# Patient Record
Sex: Female | Born: 1985 | Race: White | Hispanic: No | Marital: Married | Smoking: Never smoker
Health system: Southern US, Community
[De-identification: ages and names within clinical notes are randomized; demographics above are authoritative.]

## PROBLEM LIST (undated history)

## (undated) ENCOUNTER — Inpatient Hospital Stay (HOSPITAL_COMMUNITY): Payer: Self-pay

## (undated) DIAGNOSIS — B958 Unspecified staphylococcus as the cause of diseases classified elsewhere: Secondary | ICD-10-CM

## (undated) DIAGNOSIS — Z98891 History of uterine scar from previous surgery: Secondary | ICD-10-CM

## (undated) DIAGNOSIS — B029 Zoster without complications: Secondary | ICD-10-CM

## (undated) DIAGNOSIS — K900 Celiac disease: Secondary | ICD-10-CM

## (undated) HISTORY — PX: UPPER GI ENDOSCOPY: SHX6162

## (undated) HISTORY — PX: WISDOM TOOTH EXTRACTION: SHX21

## (undated) HISTORY — PX: PILONIDAL CYST / SINUS EXCISION: SUR543

## (undated) HISTORY — PX: TOE SURGERY: SHX1073

---

## 2010-09-21 ENCOUNTER — Other Ambulatory Visit (HOSPITAL_COMMUNITY): Payer: Self-pay | Admitting: Gastroenterology

## 2010-09-21 DIAGNOSIS — R110 Nausea: Secondary | ICD-10-CM

## 2010-09-29 ENCOUNTER — Ambulatory Visit (HOSPITAL_COMMUNITY)
Admission: RE | Admit: 2010-09-29 | Discharge: 2010-09-29 | Disposition: A | Discharge status: Home / Self Care | Payer: Self-pay | Source: Ambulatory Visit | Attending: Gastroenterology | Admitting: Gastroenterology

## 2010-09-29 DIAGNOSIS — R109 Unspecified abdominal pain: Secondary | ICD-10-CM | POA: Insufficient documentation

## 2010-09-29 DIAGNOSIS — R110 Nausea: Secondary | ICD-10-CM | POA: Insufficient documentation

## 2011-01-01 ENCOUNTER — Other Ambulatory Visit (HOSPITAL_COMMUNITY)
Admission: RE | Admit: 2011-01-01 | Discharge: 2011-01-01 | Disposition: A | Discharge status: Home / Self Care | Payer: Self-pay | Source: Ambulatory Visit | Attending: Family Medicine | Admitting: Family Medicine

## 2011-01-01 DIAGNOSIS — Z01419 Encounter for gynecological examination (general) (routine) without abnormal findings: Secondary | ICD-10-CM | POA: Insufficient documentation

## 2011-07-20 ENCOUNTER — Emergency Department (HOSPITAL_COMMUNITY)
Admission: EM | Admit: 2011-07-20 | Discharge: 2011-07-20 | Disposition: A | Discharge status: Home / Self Care | Payer: BC Managed Care – PPO | Attending: Emergency Medicine | Admitting: Emergency Medicine

## 2011-07-20 ENCOUNTER — Emergency Department (HOSPITAL_COMMUNITY): Payer: BC Managed Care – PPO

## 2011-07-20 ENCOUNTER — Encounter (HOSPITAL_COMMUNITY): Payer: Self-pay | Admitting: *Deleted

## 2011-07-20 DIAGNOSIS — R109 Unspecified abdominal pain: Secondary | ICD-10-CM | POA: Insufficient documentation

## 2011-07-20 DIAGNOSIS — R112 Nausea with vomiting, unspecified: Secondary | ICD-10-CM | POA: Insufficient documentation

## 2011-07-20 LAB — COMPREHENSIVE METABOLIC PANEL
ALT: 14 U/L (ref 0–35)
Alkaline Phosphatase: 63 U/L (ref 39–117)
BUN: 10 mg/dL (ref 6–23)
CO2: 25 mEq/L (ref 19–32)
GFR calc Af Amer: >90 mL/min (ref >90)
GFR calc non Af Amer: >90 mL/min (ref >90)
Glucose, Bld: 108 mg/dL — ABNORMAL HIGH (ref 70–99)
Potassium: 3.6 mEq/L (ref 3.5–5.1)
Sodium: 140 mEq/L (ref 135–145)
Total Bilirubin: 0.3 mg/dL (ref 0.3–1.2)

## 2011-07-20 LAB — DIFFERENTIAL
Lymphocytes Relative: 4 % — ABNORMAL LOW (ref 12–46)
Lymphs Abs: 0.4 10*3/uL — ABNORMAL LOW (ref 0.7–4.0)
Monocytes Relative: 10 % (ref 3–12)
Neutrophils Relative %: 86 % — ABNORMAL HIGH (ref 43–77)

## 2011-07-20 LAB — URINALYSIS, ROUTINE W REFLEX MICROSCOPIC
Glucose, UA: NEGATIVE mg/dL
Hgb urine dipstick: NEGATIVE
Ketones, ur: 15 mg/dL — AB
Protein, ur: NEGATIVE mg/dL
pH: 7 (ref 5.0–8.0)

## 2011-07-20 LAB — CBC
Hemoglobin: 13.2 g/dL (ref 12.0–15.0)
MCH: 29.3 pg (ref 26.0–34.0)
MCV: 86.4 fL (ref 78.0–100.0)
Platelets: 160 10*3/uL (ref 150–400)
RBC: 4.5 MIL/uL (ref 3.87–5.11)
WBC: 11.8 10*3/uL — ABNORMAL HIGH (ref 4.0–10.5)

## 2011-07-20 LAB — LIPASE, BLOOD: Lipase: 29 U/L (ref 11–59)

## 2011-07-20 MED ORDER — METOCLOPRAMIDE HCL 5 MG/ML IJ SOLN
10.0000 mg | Freq: Once | INTRAMUSCULAR | Status: AC
  Administered 2011-07-20: 10 mg via INTRAVENOUS
  Filled 2011-07-20: qty 2

## 2011-07-20 MED ORDER — ONDANSETRON 8 MG PO TBDP: 8.0000 mg | ORAL_TABLET | Freq: Three times a day (TID) | ORAL | Status: AC | PRN

## 2011-07-20 MED ORDER — METOCLOPRAMIDE HCL 10 MG PO TABS: 10.0000 mg | ORAL_TABLET | Freq: Four times a day (QID) | ORAL | Status: DC

## 2011-07-20 MED ORDER — ONDANSETRON HCL 4 MG/2ML IJ SOLN
4.0000 mg | Freq: Once | INTRAMUSCULAR | Status: AC
  Administered 2011-07-20: 4 mg via INTRAVENOUS
  Filled 2011-07-20: qty 2

## 2011-07-20 MED ORDER — SODIUM CHLORIDE 0.9 % IV SOLN
1000.0000 mL | Freq: Once | INTRAVENOUS | Status: AC
  Administered 2011-07-20: 1000 mL via INTRAVENOUS

## 2011-07-20 MED ORDER — HYDROMORPHONE HCL PF 1 MG/ML IJ SOLN
1.0000 mg | Freq: Once | INTRAMUSCULAR | Status: AC
  Administered 2011-07-20: 1 mg via INTRAVENOUS
  Filled 2011-07-20: qty 1

## 2011-07-20 MED ORDER — ONDANSETRON HCL 4 MG/2ML IJ SOLN
4.0000 mg | Freq: Once | INTRAMUSCULAR | Status: AC
  Administered 2011-07-20: 4 mg via INTRAVENOUS
  Filled 2011-07-20: qty 4

## 2011-07-20 MED ORDER — SODIUM CHLORIDE 0.9 % IV SOLN
1000.0000 mL | INTRAVENOUS | Status: DC
  Administered 2011-07-20: 1000 mL via INTRAVENOUS

## 2011-07-20 NOTE — ED Notes (Signed)
Pt, with hx of IBS, to ED c/o nausea x 2 weeks q time she eats and epigastric pain and emesis x 2 today.

## 2011-07-20 NOTE — ED Notes (Signed)
Patient transported to Ultrasound 

## 2011-07-20 NOTE — ED Provider Notes (Signed)
History     CSN: [id number]  Arrival date & time 07/20/11  0031   First MD Initiated Contact with Patient 07/20/11 0046      Chief Complaint  Patient presents with  . Abdominal Pain    (Consider location/radiation/quality/duration/timing/severity/associated sxs/prior treatment) HPI Patient has been having difficulty with nausea and a mild amount of abdominal discomfort for the last 2 weeks. She has not had any trouble with vomiting or diarrhea. She has not had any fevers or dysuria. This evening after eating she had a recurrence in an increase in the abdominal pain. Primarily located in her upper abdomen but does seem to spread throughout her abdomen. Does not radiate to her back or her pelvic region.  The pain is sharp and severe. Nothing seems to make it better or worse. She has had no prior surgeries. History reviewed. No pertinent past medical history.  History reviewed. No pertinent past surgical history.  No family history on file.  History  Substance Use Topics  . Smoking status: Never Smoker   . Smokeless tobacco: Not on file  . Alcohol Use: No    OB History    Grav Para Term Preterm Abortions TAB SAB Ect Mult Living                  Review of Systems  All other systems reviewed and are negative.    Allergies  Review of patient's allergies indicates no known allergies.  Home Medications  No current outpatient prescriptions on file.  BP 120/68  Pulse 88  Temp(Src) 98.2 F (36.8 C) (Oral)  Resp 20  SpO2 99%  LMP 07/06/2011  Physical Exam  Nursing note and vitals reviewed. Constitutional: She appears well-developed and well-nourished. No distress.  HENT:  Head: Normocephalic and atraumatic.  Right Ear: External ear normal.  Left Ear: External ear normal.  Eyes: Conjunctivae are normal. Right eye exhibits no discharge. Left eye exhibits no discharge. No scleral icterus.  Neck: Neck supple. No tracheal deviation present.  Cardiovascular: Normal rate,  regular rhythm and intact distal pulses.   Pulmonary/Chest: Effort normal and breath sounds normal. No stridor. No respiratory distress. She has no wheezes. She has no rales.  Abdominal: Soft. Bowel sounds are normal. She exhibits no distension, no ascites and no mass. There is tenderness in the right upper quadrant and epigastric area. There is no rigidity, no rebound and no guarding. No hernia.  Musculoskeletal: She exhibits no edema and no tenderness.  Neurological: She is alert. She has normal strength. No sensory deficit. Cranial nerve deficit:  no gross defecits noted. She exhibits normal muscle tone. She displays no seizure activity. Coordination normal.  Skin: Skin is warm and dry. No rash noted.  Psychiatric: She has a normal mood and affect.    ED Course  Procedures (including critical care time)  Labs Reviewed  URINALYSIS, ROUTINE W REFLEX MICROSCOPIC - Abnormal; Notable for the following:    Ketones, ur 15 (*)    Leukocytes, UA TRACE (*)    All other components within normal limits  CBC - Abnormal; Notable for the following:    WBC 11.8 (*)    All other components within normal limits  DIFFERENTIAL - Abnormal; Notable for the following:    Neutrophils Relative 86 (*)    Neutro Abs 10.1 (*)    Lymphocytes Relative 4 (*)    Lymphs Abs 0.4 (*)    Monocytes Absolute 1.1 (*)    All other components within normal limits  COMPREHENSIVE METABOLIC PANEL - Abnormal; Notable for the following:    Glucose, Bld 108 (*)    All other components within normal limits  URINE MICROSCOPIC-ADD ON - Abnormal; Notable for the following:    Squamous Epithelial / LPF FEW (*)    Bacteria, UA FEW (*)    All other components within normal limits  POCT PREGNANCY, URINE  LIPASE, BLOOD   US Abdomen Complete  07/20/2011  *RADIOLOGY REPORT*  Clinical Data:  Abdominal pain.  ABDOMINAL ULTRASOUND COMPLETE  Comparison:  Abdominal ultrasound performed 09/29/2010  Findings:  Gallbladder:  The gallbladder  is normal in appearance, without evidence for gallstones, gallbladder wall thickening or pericholecystic fluid.  No ultrasonographic Murphy's sign is elicited.  Common Bile Duct:  0.5 cm in diameter; within normal limits in caliber.  Liver:  Normal parenchymal echogenicity and echotexture; no focal lesions identified.  Limited Doppler evaluation demonstrates normal blood flow within the liver.  IVC:  Unremarkable in appearance.  Pancreas:  Although the pancreas is difficult to visualize in its entirety due to overlying bowel gas, no focal pancreatic abnormality is identified.  Spleen:  10.1 cm in length; within normal limits in size and echotexture.  Right kidney:  11.4 cm in length; normal in size, configuration and parenchymal echogenicity.  No evidence of mass or hydronephrosis.  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  Otherwise normal in size, configuration and parenchymal echogenicity.  No evidence of mass or hydronephrosis.  Abdominal Aorta:  Normal in caliber; no aneurysm identified.  IMPRESSION:  1.  No acute abnormalities seen within the abdomen. 2.  Duplex left kidney incidentally noted.  Original Report Authenticated By: Tonia Ghent, M.D.      MDM  4:14 AM Repeat Exam.  No ttp rlq or ruq.  No rebound or guarding.   The patient was treated with IV fluids and antinausea medications. She is feeling better at this time. She does not have any tenderness in the right lower abdomen. There is low suspicion for appendicitis. The ultrasound of her abdomen does not show evidence of cholecystitis, cholelithiasis or aortic aneurysm. She has no hematuria or signs of urinary tract infection. I doubt renal colic. I suspect her symptoms could be related to a gastroenteritis. Patient discharged home with a prescription for nausea medications. I instructed her to return to emergency room for worsening symptoms, abdominal pain in the lower abdomen       Celene Kras,  MD 07/20/11 [phone]

## 2011-07-20 NOTE — ED Notes (Signed)
IV team at bedside 

## 2011-07-20 NOTE — ED Notes (Signed)
abd pain for 2 weeks with n v tonight.  No diarrhea.  lmp 2 weeks ago

## 2011-07-20 NOTE — Discharge Instructions (Signed)
Abdominal Pain Abdominal pain can be caused by many things. Your caregiver decides the seriousness of your pain by an examination and possibly blood tests and X-rays. Many cases can be observed and treated at home. Most abdominal pain is not caused by a disease and will probably improve without treatment. However, in many cases, more time must pass before a clear cause of the pain can be found. Before that point, it may not be known if you need more testing, or if hospitalization or surgery is needed. HOME CARE INSTRUCTIONS   Do not take laxatives unless directed by your caregiver.   Take pain medicine only as directed by your caregiver.   Only take over-the-counter or prescription medicines for pain, discomfort, or fever as directed by your caregiver.   Try a clear liquid diet (broth, tea, or water) for as long as directed by your caregiver. Slowly move to a bland diet as tolerated.  SEEK IMMEDIATE MEDICAL CARE IF:   The pain does not go away.   You have a fever.   You keep throwing up (vomiting).   The pain is felt only in portions of the abdomen. Pain in the right side could possibly be appendicitis. In an adult, pain in the left lower portion of the abdomen could be colitis or diverticulitis.   You pass bloody or black tarry stools.  MAKE SURE YOU:   Understand these instructions.   Will watch your condition.   Will get help right away if you are not doing well or get worse.  Document Released: 01/03/2005 Document Revised: 03/15/2011 Document Reviewed: 11/12/2007 [facility] Patient Information 2012 [place], [place].Nausea and Vomiting Nausea means you feel sick to your stomach. Throwing up (vomiting) is a reflex where stomach contents come out of your mouth. HOME CARE   Take medicine as told by your doctor.   Do not force yourself to eat. However, you do need to drink fluids.   If you feel like eating, eat a normal diet as told by your doctor.   Eat rice, wheat, potatoes,  bread, lean meats, yogurt, fruits, and vegetables.   Avoid high-fat foods.   Drink enough fluids to keep your pee (urine) clear or pale yellow.   Ask your doctor how to replace body fluid losses (rehydrate). Signs of body fluid loss (dehydration) include:   Feeling very thirsty.   Dry lips and mouth.   Feeling dizzy.   Dark pee.   Peeing less than normal.   Feeling confused.   Fast breathing or heart rate.  GET HELP RIGHT AWAY IF:   You have blood in your throw up.   You have black or bloody poop (stool).   You have a bad headache or stiff neck.   You feel confused.   You have bad belly (abdominal) pain.   You have chest pain or trouble breathing.   You do not pee at least once every 8 hours.   You have cold, clammy skin.   You keep throwing up after 24 to 48 hours.   You have a fever.  MAKE SURE YOU:   Understand these instructions.   Will watch your condition.   Will get help right away if you are not doing well or get worse.  Document Released: 09/12/2007 Document Revised: 03/15/2011 Document Reviewed: 08/25/2010 [facility] Patient Information 2012 [place], [place].

## 2011-07-20 NOTE — ED Notes (Signed)
2 attempts by this RN and 1 by another RN.  IV team paged and responded, stating they wiould be here shortly.

## 2011-07-20 NOTE — ED Notes (Signed)
Pt states pain reduced to 3/10 and nausea beginning to reduce.

## 2011-07-31 ENCOUNTER — Other Ambulatory Visit: Payer: Self-pay | Admitting: Gastroenterology

## 2011-07-31 DIAGNOSIS — R112 Nausea with vomiting, unspecified: Secondary | ICD-10-CM

## 2011-07-31 DIAGNOSIS — R1013 Epigastric pain: Secondary | ICD-10-CM

## 2011-08-16 ENCOUNTER — Other Ambulatory Visit: Payer: Self-pay | Admitting: Gastroenterology

## 2011-08-16 DIAGNOSIS — R110 Nausea: Secondary | ICD-10-CM

## 2011-08-28 ENCOUNTER — Encounter (HOSPITAL_COMMUNITY)
Admission: RE | Admit: 2011-08-28 | Discharge: 2011-08-28 | Disposition: A | Discharge status: Home / Self Care | Payer: BC Managed Care – PPO | Source: Ambulatory Visit | Attending: Gastroenterology | Admitting: Gastroenterology

## 2011-08-28 DIAGNOSIS — R109 Unspecified abdominal pain: Secondary | ICD-10-CM | POA: Insufficient documentation

## 2011-08-28 DIAGNOSIS — R110 Nausea: Secondary | ICD-10-CM | POA: Insufficient documentation

## 2011-08-28 MED ORDER — SINCALIDE 5 MCG IJ SOLR
INTRAMUSCULAR | Status: AC
  Administered 2011-08-28: 1.49 ug
  Filled 2011-08-28: qty 5

## 2011-08-28 MED ORDER — TECHNETIUM TC 99M MEBROFENIN IV KIT
5.0000 | PACK | Freq: Once | INTRAVENOUS | Status: AC | PRN
  Administered 2011-08-28: 5 via INTRAVENOUS

## 2011-10-13 ENCOUNTER — Emergency Department (HOSPITAL_COMMUNITY)
Admission: EM | Admit: 2011-10-13 | Discharge: 2011-10-13 | Disposition: A | Discharge status: Home / Self Care | Payer: BC Managed Care – PPO | Attending: Emergency Medicine | Admitting: Emergency Medicine

## 2011-10-13 ENCOUNTER — Encounter (HOSPITAL_COMMUNITY): Payer: Self-pay | Admitting: Emergency Medicine

## 2011-10-13 DIAGNOSIS — L089 Local infection of the skin and subcutaneous tissue, unspecified: Secondary | ICD-10-CM

## 2011-10-13 DIAGNOSIS — L0889 Other specified local infections of the skin and subcutaneous tissue: Secondary | ICD-10-CM | POA: Insufficient documentation

## 2011-10-13 DIAGNOSIS — K900 Celiac disease: Secondary | ICD-10-CM | POA: Insufficient documentation

## 2011-10-13 HISTORY — DX: Celiac disease: K90.0

## 2011-10-13 LAB — CBC WITH DIFFERENTIAL/PLATELET
Eosinophils Absolute: 0.1 10*3/uL (ref 0.0–0.7)
Eosinophils Relative: 1 % (ref 0–5)
HCT: 36.9 % (ref 36.0–46.0)
Hemoglobin: 12.4 g/dL (ref 12.0–15.0)
Lymphs Abs: 2.5 10*3/uL (ref 0.7–4.0)
MCH: 29 pg (ref 26.0–34.0)
MCV: 86.2 fL (ref 78.0–100.0)
Monocytes Absolute: 0.6 10*3/uL (ref 0.1–1.0)
Monocytes Relative: 9 % (ref 3–12)
Neutrophils Relative %: 50 % (ref 43–77)
RBC: 4.28 MIL/uL (ref 3.87–5.11)

## 2011-10-13 MED ORDER — CLINDAMYCIN HCL 150 MG PO CAPS: 150.0000 mg | ORAL_CAPSULE | Freq: Four times a day (QID) | ORAL | Status: AC

## 2011-10-13 MED ORDER — SODIUM CHLORIDE 0.9 % IV SOLN
Freq: Once | INTRAVENOUS | Status: AC
  Administered 2011-10-13: 21:00:00 via INTRAVENOUS

## 2011-10-13 MED ORDER — KETOROLAC TROMETHAMINE 30 MG/ML IJ SOLN
30.0000 mg | Freq: Once | INTRAMUSCULAR | Status: AC
  Administered 2011-10-13: 30 mg via INTRAVENOUS
  Filled 2011-10-13: qty 1

## 2011-10-13 MED ORDER — SODIUM CHLORIDE 0.9 % IV SOLN: Freq: Once | INTRAVENOUS | Status: DC

## 2011-10-13 MED ORDER — CLINDAMYCIN PHOSPHATE 900 MG/50ML IV SOLN
900.0000 mg | Freq: Once | INTRAVENOUS | Status: AC
  Administered 2011-10-13: 900 mg via INTRAVENOUS
  Filled 2011-10-13 (×2): qty 50

## 2011-10-13 NOTE — ED Notes (Signed)
Patient complaining of possible bug bites on her chin; was seen at a walk-in clinic this past week.  Was given an antibiotic due to seeping pus from the wound; patient states that she has had two more "sores" pop up on her hands since her visit.

## 2011-10-13 NOTE — ED Notes (Signed)
Pt for discharge.Vital signs stable and GCS 15 

## 2011-10-13 NOTE — ED Provider Notes (Signed)
History     CSN: [id number]  Arrival date & time 10/13/11  1911   None     Chief Complaint  Patient presents with  . Insect Bite    (Consider location/radiation/quality/duration/timing/severity/associated sxs/prior treatment) HPI Comments: Patient is currently being treated for a "bug bite on her chin."  With doxycycline, that she's been taking for the past 5 days in the past 24 hours.  She has noted that she has developed new lesions on her neck and on her left index finger, right at the edge of the nailbed is a dorsal aspect of the left thumb or painful.  The lesion of the edge of her nail has been draining for the past 12 hours.  She denies fever, but does report generalized myalgias, and a headache  The history is provided by the patient.    Past Medical History  Diagnosis Date  . Celiac disease     History reviewed. No pertinent past surgical history.  History reviewed. No pertinent family history.  History  Substance Use Topics  . Smoking status: Never Smoker   . Smokeless tobacco: Not on file  . Alcohol Use: No    OB History    Grav Para Term Preterm Abortions TAB SAB Ect Mult Living                  Review of Systems  Constitutional: Positive for chills. Negative for fever.  HENT: Negative for mouth sores, trouble swallowing and dental problem.   Musculoskeletal: Negative for joint swelling.  Skin: Positive for wound.  Neurological: Positive for headaches.    Allergies  Review of patient's allergies indicates no known allergies.  Home Medications   Current Outpatient Rx  Name Route Sig Dispense Refill  . DEXLANSOPRAZOLE 60 MG PO CPDR Oral Take 60 mg by mouth daily.    Marland Kitchen DOXYCYCLINE HYCLATE 100 MG PO TABS Oral Take 100 mg by mouth 2 (two) times daily.    Marland Kitchen FLUCONAZOLE 150 MG PO TABS Oral Take 150 mg by mouth once.    Marland Kitchen CLINDAMYCIN HCL 150 MG PO CAPS Oral Take 1 capsule (150 mg total) by mouth every 6 (six) hours. 28 capsule 0    BP 100/68  Pulse  61  Temp 98.1 F (36.7 C) (Oral)  Resp 16  SpO2 99%  LMP 10/02/2011  Physical Exam  Constitutional: She appears well-developed and well-nourished.  HENT:  Head: Normocephalic.       No oral or dental lesions  Eyes: Pupils are equal, round, and reactive to light.  Neck: Normal range of motion.  Cardiovascular: Normal rate.   Pulmonary/Chest: Effort normal.  Musculoskeletal: Normal range of motion. She exhibits tenderness. She exhibits no edema.       Small draining lesion at the, distal tip of the left index finger, age of the nail.  Vision, has clipped her fingernails.  Below this area to facilitate draining  Lymphadenopathy:       Head (right side): Submental adenopathy present. No submandibular, no tonsillar and no preauricular adenopathy present.    She has no cervical adenopathy.  Neurological: She is alert.  Skin: Skin is warm.    ED Course  Procedures (including critical care time)   Labs Reviewed  CBC WITH DIFFERENTIAL   No results found.   1. Skin infection       MDM  Will obtain CBC, place, an IV give patient 1 L of fluid, +30 mg of Toradol.  I headache, and myalgias as  well as 900 mg of clindamycin as a first dose changing her antibiotic regime.  She does have a primary care physician, who she will be able to followup with next week After IV by diuretics completed.  The sore on her chin as well as her finger appear smaller and less red       Arman Filter, NP 10/13/11 2052  Arman Filter, NP 10/13/11 2311

## 2011-10-19 NOTE — ED Provider Notes (Signed)
Medical screening examination/treatment/procedure(s) were performed by non-physician practitioner and as supervising physician I was immediately available for consultation/collaboration.  Avonte Sensabaugh, MD 10/19/11 0006 

## 2012-01-01 ENCOUNTER — Emergency Department (HOSPITAL_COMMUNITY): Payer: BC Managed Care – PPO

## 2012-01-01 ENCOUNTER — Encounter (HOSPITAL_COMMUNITY): Payer: Self-pay | Admitting: Emergency Medicine

## 2012-01-01 ENCOUNTER — Emergency Department (HOSPITAL_COMMUNITY)
Admission: EM | Admit: 2012-01-01 | Discharge: 2012-01-01 | Disposition: A | Discharge status: Home / Self Care | Payer: BC Managed Care – PPO | Attending: Emergency Medicine | Admitting: Emergency Medicine

## 2012-01-01 DIAGNOSIS — R52 Pain, unspecified: Secondary | ICD-10-CM | POA: Insufficient documentation

## 2012-01-01 DIAGNOSIS — N390 Urinary tract infection, site not specified: Secondary | ICD-10-CM

## 2012-01-01 DIAGNOSIS — R109 Unspecified abdominal pain: Secondary | ICD-10-CM

## 2012-01-01 DIAGNOSIS — Z8719 Personal history of other diseases of the digestive system: Secondary | ICD-10-CM | POA: Insufficient documentation

## 2012-01-01 LAB — WET PREP, GENITAL
Clue Cells Wet Prep HPF POC: NONE SEEN
Trich, Wet Prep: NONE SEEN
Yeast Wet Prep HPF POC: NONE SEEN

## 2012-01-01 LAB — CBC WITH DIFFERENTIAL/PLATELET
Basophils Absolute: 0.1 K/uL (ref 0.0–0.1)
Basophils Relative: 1 % (ref 0–1)
Eosinophils Absolute: 0.1 K/uL (ref 0.0–0.7)
Eosinophils Relative: 1 % (ref 0–5)
HCT: 39.9 % (ref 36.0–46.0)
Hemoglobin: 13.3 g/dL (ref 12.0–15.0)
Lymphocytes Relative: 29 % (ref 12–46)
Lymphs Abs: 2.5 10*3/uL (ref 0.7–4.0)
MCH: 29.2 pg (ref 26.0–34.0)
MCHC: 33.3 g/dL (ref 30.0–36.0)
MCV: 87.5 fL (ref 78.0–100.0)
Monocytes Absolute: 0.6 K/uL (ref 0.1–1.0)
Monocytes Relative: 8 % (ref 3–12)
Neutro Abs: 5.2 10*3/uL (ref 1.7–7.7)
Neutrophils Relative %: 61 % (ref 43–77)
Platelets: 227 K/uL (ref 150–400)
RBC: 4.56 MIL/uL (ref 3.87–5.11)
RDW: 13.3 % (ref 11.5–15.5)
WBC: 8.5 10*3/uL (ref 4.0–10.5)

## 2012-01-01 LAB — URINALYSIS, ROUTINE W REFLEX MICROSCOPIC
Bilirubin Urine: NEGATIVE
Glucose, UA: NEGATIVE mg/dL
Ketones, ur: 15 mg/dL — AB
Nitrite: NEGATIVE
Protein, ur: NEGATIVE mg/dL
Specific Gravity, Urine: 1.007 (ref 1.005–1.030)
Urobilinogen, UA: 0.2 mg/dL (ref 0.0–1.0)
pH: 7 (ref 5.0–8.0)

## 2012-01-01 LAB — COMPREHENSIVE METABOLIC PANEL
Albumin: 4.9 g/dL (ref 3.5–5.2)
Alkaline Phosphatase: 65 U/L (ref 39–117)
BUN: 12 mg/dL (ref 6–23)
Chloride: 102 mEq/L (ref 96–112)
Glucose, Bld: 84 mg/dL (ref 70–99)
Potassium: 3.3 mEq/L — ABNORMAL LOW (ref 3.5–5.1)
Total Bilirubin: 0.5 mg/dL (ref 0.3–1.2)

## 2012-01-01 LAB — COMPREHENSIVE METABOLIC PANEL WITH GFR
ALT: 17 U/L (ref 0–35)
AST: 27 U/L (ref 0–37)
CO2: 25 meq/L (ref 19–32)
Calcium: 10 mg/dL (ref 8.4–10.5)
Creatinine, Ser: 0.7 mg/dL (ref 0.50–1.10)
GFR calc Af Amer: >90 mL/min (ref >90)
GFR calc non Af Amer: >90 mL/min (ref >90)
Sodium: 141 meq/L (ref 135–145)
Total Protein: 8.4 g/dL — ABNORMAL HIGH (ref 6.0–8.3)

## 2012-01-01 LAB — URINE MICROSCOPIC-ADD ON

## 2012-01-01 LAB — LIPASE, BLOOD: Lipase: 111 U/L — ABNORMAL HIGH (ref 11–59)

## 2012-01-01 LAB — POCT PREGNANCY, URINE: Preg Test, Ur: NEGATIVE

## 2012-01-01 MED ORDER — MORPHINE SULFATE 4 MG/ML IJ SOLN
4.0000 mg | Freq: Once | INTRAMUSCULAR | Status: AC
  Administered 2012-01-01: 4 mg via INTRAVENOUS
  Filled 2012-01-01: qty 1

## 2012-01-01 MED ORDER — KETOROLAC TROMETHAMINE 30 MG/ML IJ SOLN
30.0000 mg | Freq: Once | INTRAMUSCULAR | Status: AC
  Administered 2012-01-01: 30 mg via INTRAVENOUS
  Filled 2012-01-01: qty 1

## 2012-01-01 MED ORDER — CIPROFLOXACIN HCL 500 MG PO TABS: 500.0000 mg | ORAL_TABLET | Freq: Two times a day (BID) | ORAL | Status: DC

## 2012-01-01 MED ORDER — SODIUM CHLORIDE 0.9 % IV SOLN
Freq: Once | INTRAVENOUS | Status: AC
  Administered 2012-01-01: 19:00:00 via INTRAVENOUS

## 2012-01-01 MED ORDER — PHENAZOPYRIDINE HCL 200 MG PO TABS: 200.0000 mg | ORAL_TABLET | Freq: Three times a day (TID) | ORAL | Status: DC

## 2012-01-01 MED ORDER — DEXTROSE 5 % IV SOLN
1.0000 g | Freq: Once | INTRAVENOUS | Status: AC
  Administered 2012-01-01: 1 g via INTRAVENOUS
  Filled 2012-01-01: qty 10

## 2012-01-01 NOTE — ED Notes (Signed)
Patient transported to CT 

## 2012-01-01 NOTE — ED Provider Notes (Signed)
History  This chart was scribed for [name]. [name], MD by Ladona Ridgel Day. This patient was seen in room TR10C/TR10C and the patient's care was started at 1619.   CSN: [id number]  Arrival date & time 01/01/12  1619   First MD Initiated Contact with Patient 01/01/12 1819      Chief Complaint  Patient presents with  . Abdominal Pain  . Flank Pain  . Urinary Tract Infection   The history is provided by the patient. No language interpreter was used.   Yolanda Berg is a 26 y.o. female who presents to the Emergency Department complaining of lower abdominal pain which began about 8 hours ago but she states some dysuria last PM and frequency past couple of days. She states the pain sometimes intensifies and radiates to her left flank and makes her nauseous.. She denies any hx of UTIs. She states past couple of days with frequency where she might void several times per hour. She denies any emesis or diarrhea. She is currently at end of her menstrual cycle and she has no sick contacts.    Past Medical History  Diagnosis Date  . Celiac disease     History reviewed. No pertinent past surgical history.  History reviewed. No pertinent family history.  History  Substance Use Topics  . Smoking status: Never Smoker   . Smokeless tobacco: Not on file  . Alcohol Use: No    OB History    Grav Para Term Preterm Abortions TAB SAB Ect Mult Living                  Review of Systems  Constitutional: Negative for fever and chills.  Respiratory: Negative for shortness of breath.   Gastrointestinal: Positive for nausea and abdominal pain (lower abdominal and left flank pain). Negative for vomiting and diarrhea.  Genitourinary: Positive for dysuria, frequency and flank pain (left flank).  Neurological: Negative for weakness.    Allergies  Review of patient's allergies indicates no known allergies.  Home Medications   Current Outpatient Rx  Name Route Sig Dispense Refill  . DEXLANSOPRAZOLE 60  MG PO CPDR Oral Take 60 mg by mouth daily.    . ADULT MULTIVITAMIN W/MINERALS CH Oral Take 1 tablet by mouth daily.      Triage Vitals: BP 125/86  Pulse 66  Temp 97.6 F (36.4 C) (Oral)  Resp 18  SpO2 100%  Physical Exam  Nursing note and vitals reviewed. Constitutional: She is oriented to person, place, and time. She appears well-developed and well-nourished. No distress.  HENT:  Head: Normocephalic and atraumatic.  Eyes: EOM are normal.  Neck: Neck supple. No tracheal deviation present.  Cardiovascular: Normal rate.   Pulmonary/Chest: Effort normal. No respiratory distress.  Abdominal: Soft. She exhibits no distension. There is tenderness (LLQ tender to palpation, tenderness over left flank ). There is no rebound and no guarding.  Musculoskeletal: Normal range of motion.  Neurological: She is alert and oriented to person, place, and time.  Skin: Skin is warm and dry.  Psychiatric: She has a normal mood and affect. Her behavior is normal.    ED Course  Procedures (including critical care time) DIAGNOSTIC STUDIES: Oxygen Saturation is 100% on room air, normal by my interpretation.    COORDINATION OF CARE: At 635 PM Discussed treatment plan with patient which includes UA/culture/preg UA, IV fluids, toradol, pain medicine, wet prep, and pelvic exam. Patient agrees.   Labs Reviewed  URINALYSIS, ROUTINE W REFLEX MICROSCOPIC - Abnormal;  Notable for the following:    Hgb urine dipstick LARGE (*)     Ketones, ur 15 (*)     Leukocytes, UA MODERATE (*)     All other components within normal limits  URINE MICROSCOPIC-ADD ON - Abnormal; Notable for the following:    Squamous Epithelial / LPF FEW (*)     Bacteria, UA FEW (*)     All other components within normal limits  POCT PREGNANCY, URINE  CBC WITH DIFFERENTIAL  COMPREHENSIVE METABOLIC PANEL  LIPASE, BLOOD  WET PREP, GENITAL  GC/CHLAMYDIA PROBE AMP, GENITAL   No results found.   1. Flank pain, acute   2. Urinary tract  infection     7:59 PM Pelvic exam shows scant discharge, cultures and wet prep sent.  Urine culture added.  WBC is ok.  No adnexal tenderness.  Will get CT scan.  Will sign out to Dr. Juleen China and [facility].  MDM  I personally performed the services described in this documentation, which was scribed in my presence. The recorded information has been reviewed and considered.    Pt with left lower and left flank mild pain, n/v.  No hematuria on UA goes against renal colic.  Some pyuria, but not very significant compared to pt's degree of pain.  Will need pelvic exam, if no adnexal tenderness, will get CT scan.  Otherwise if adnexal tenderness or mass, will get U/S.          [name]. [name], MD 01/01/12 2001

## 2012-01-01 NOTE — ED Notes (Signed)
Pt c/o lower abd pain up into left flank starting today; pt sts difficulty time urinating yesterday and sts pain much worse after urinating today

## 2012-01-02 LAB — URINE CULTURE: Colony Count: >=100000

## 2012-01-02 LAB — GC/CHLAMYDIA PROBE AMP, GENITAL
Chlamydia, DNA Probe: NEGATIVE
GC Probe Amp, Genital: NEGATIVE

## 2012-01-04 NOTE — ED Notes (Addendum)
+   urine rx for Fluconazole 150 mg SIG: 150 mg po 1 X Disp: # 1 refill none per Mardella Layman.

## 2012-01-11 ENCOUNTER — Telehealth (HOSPITAL_COMMUNITY): Payer: Self-pay | Admitting: *Deleted

## 2012-01-11 NOTE — ED Notes (Signed)
Patient returned  Call and wants rx called to [facility] on [address]

## 2012-01-12 ENCOUNTER — Encounter (HOSPITAL_COMMUNITY): Payer: Self-pay | Admitting: Emergency Medicine

## 2012-01-12 ENCOUNTER — Emergency Department (HOSPITAL_COMMUNITY)
Admission: EM | Admit: 2012-01-12 | Discharge: 2012-01-13 | Disposition: A | Discharge status: Home / Self Care | Payer: BC Managed Care – PPO | Attending: Emergency Medicine | Admitting: Emergency Medicine

## 2012-01-12 DIAGNOSIS — B958 Unspecified staphylococcus as the cause of diseases classified elsewhere: Secondary | ICD-10-CM | POA: Insufficient documentation

## 2012-01-12 DIAGNOSIS — L089 Local infection of the skin and subcutaneous tissue, unspecified: Secondary | ICD-10-CM | POA: Insufficient documentation

## 2012-01-12 HISTORY — DX: Unspecified staphylococcus as the cause of diseases classified elsewhere: B95.8

## 2012-01-12 MED ORDER — CLINDAMYCIN PHOSPHATE 600 MG/50ML IV SOLN
600.0000 mg | Freq: Once | INTRAVENOUS | Status: AC
  Administered 2012-01-13: 600 mg via INTRAVENOUS
  Filled 2012-01-12 (×2): qty 50

## 2012-01-12 NOTE — ED Provider Notes (Signed)
History     CSN: [id number]  Arrival date & time 01/12/12  1612   None     Chief Complaint  Patient presents with  . Wound Check    Chin    (Consider location/radiation/quality/duration/timing/severity/associated sxs/prior treatment) HPI History provided by pt.   Pt presents w/ painful lesion on chin.  Draining yellow fluid.  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  She has been compliant w/ her antibiotics over the past few days, but lesion continues to get bigger.  Had the same in 10/2011 that was refractory to po abx and eventually spread to her neck and finger.  It improved w/ a single dose of IV clindamycin in ED.  Pt requests IV clindamycin today to prevent the infection from worsening.   Past Medical History  Diagnosis Date  . Celiac disease   . Staph infection     History reviewed. No pertinent past surgical history.  No family history on file.  History  Substance Use Topics  . Smoking status: Never Smoker   . Smokeless tobacco: Not on file  . Alcohol Use: No    OB History    Grav Para Term Preterm Abortions TAB SAB Ect Mult Living                  Review of Systems  All other systems reviewed and are negative.    Allergies  Review of patient's allergies indicates no known allergies.  Home Medications   Current Outpatient Rx  Name Route Sig Dispense Refill  . CLINDAMYCIN HCL 300 MG PO CAPS Oral Take 300 mg by mouth 3 (three) times daily.    . ADULT MULTIVITAMIN W/MINERALS CH Oral Take 1 tablet by mouth daily.    Marland Kitchen MUPIROCIN 2 % EX OINT Topical Apply 1 application topically 3 (three) times daily.      BP 97/54  Pulse 58  Temp 97.9 F (36.6 C) (Oral)  Resp 12  SpO2 100%  LMP 12/31/2011  Physical Exam  Nursing note and vitals reviewed. Constitutional: She is oriented to person, place, and time. She appears well-developed and  well-nourished. No distress.  HENT:  Head: Normocephalic and atraumatic.       No trismus.    Eyes:       Normal appearance  Neck: Normal range of motion.       Mild submental edema  Cardiovascular: Normal rate and regular rhythm.   Pulmonary/Chest: Effort normal and breath sounds normal. No respiratory distress.  Musculoskeletal: Normal range of motion.  Neurological: She is alert and oriented to person, place, and time.  Skin: Skin is warm and dry. No rash noted.       1cm vesicular lesion w/ a small amt of yellow crusting in the center of chin.  No active drainage.  Narrow ring of surrounding erythema.  Ttp.  Lesion can not be palpated from inside the mouth.   Psychiatric: She has a normal mood and affect. Her behavior is normal.       tearful    ED Course  Procedures (including critical care time)  Labs Reviewed - No data to display No results found.   1. Staph skin infection       MDM  26yo F presents w/ c/o recurrent staph infection of chin that has increased in size and severity of pain over the past few days, despite compliance w/  po clinda and topical mupirocin prescribed by her PCP.  Had same in 10/2011 and spread to neck and finger.  She requests a dose of IV clindamycin which seemed to help in July.  Exam is consistent w/ superficial staph infection.  Will treat patient w/ single dose of IV clindamycin.  She declines pain medication.  Recommended completion of oral abx and mupirocin or hydrogen peroxide cream,  f/u with her PCP if sx have not improved by Monday.  Return precautions discussed.         Otilio Miu, [place] 01/13/12 [phone]

## 2012-01-12 NOTE — ED Notes (Signed)
Pt reports possible skin infection onset Wednesday. Pt seen by PMD and given PO antibiotics. Pt reports area increasing in size and draining pus. Pt tearful in triage.

## 2012-01-12 NOTE — ED Notes (Signed)
Pt st's she has a skin infection on her chin.  St's she had the same thing approx 1 yr ago and had to have IV antibiotics.

## 2012-01-13 NOTE — ED Notes (Signed)
Ice pack given

## 2012-01-14 NOTE — ED Provider Notes (Signed)
Medical screening examination/treatment/procedure(s) were performed by non-physician practitioner and as supervising physician I was immediately available for consultation/collaboration.   Richardean Canal, MD 01/14/12 [phone]

## 2012-04-30 ENCOUNTER — Emergency Department (HOSPITAL_BASED_OUTPATIENT_CLINIC_OR_DEPARTMENT_OTHER): Payer: BC Managed Care – PPO

## 2012-04-30 ENCOUNTER — Encounter (HOSPITAL_BASED_OUTPATIENT_CLINIC_OR_DEPARTMENT_OTHER): Payer: Self-pay | Admitting: Family Medicine

## 2012-04-30 ENCOUNTER — Emergency Department (HOSPITAL_BASED_OUTPATIENT_CLINIC_OR_DEPARTMENT_OTHER)
Admission: EM | Admit: 2012-04-30 | Discharge: 2012-04-30 | Disposition: A | Discharge status: Home / Self Care | Payer: BC Managed Care – PPO | Attending: Emergency Medicine | Admitting: Emergency Medicine

## 2012-04-30 DIAGNOSIS — R220 Localized swelling, mass and lump, head: Secondary | ICD-10-CM | POA: Insufficient documentation

## 2012-04-30 DIAGNOSIS — L089 Local infection of the skin and subcutaneous tissue, unspecified: Secondary | ICD-10-CM | POA: Insufficient documentation

## 2012-04-30 DIAGNOSIS — R221 Localized swelling, mass and lump, neck: Secondary | ICD-10-CM | POA: Insufficient documentation

## 2012-04-30 DIAGNOSIS — R51 Headache: Secondary | ICD-10-CM | POA: Insufficient documentation

## 2012-04-30 DIAGNOSIS — Z8719 Personal history of other diseases of the digestive system: Secondary | ICD-10-CM | POA: Insufficient documentation

## 2012-04-30 DIAGNOSIS — Z8619 Personal history of other infectious and parasitic diseases: Secondary | ICD-10-CM | POA: Insufficient documentation

## 2012-04-30 MED ORDER — CEPHALEXIN 500 MG PO CAPS: 500.0000 mg | ORAL_CAPSULE | Freq: Four times a day (QID) | ORAL | Status: DC

## 2012-04-30 MED ORDER — CEPHALEXIN 250 MG PO CAPS
500.0000 mg | ORAL_CAPSULE | Freq: Once | ORAL | Status: AC
  Administered 2012-04-30: 500 mg via ORAL
  Filled 2012-04-30: qty 2

## 2012-04-30 MED ORDER — SULFAMETHOXAZOLE-TRIMETHOPRIM 800-160 MG PO TABS: 1.0000 | ORAL_TABLET | Freq: Two times a day (BID) | ORAL | Status: DC

## 2012-04-30 MED ORDER — HYDROCODONE-ACETAMINOPHEN 5-325 MG PO TABS
1.0000 | ORAL_TABLET | Freq: Once | ORAL | Status: AC
  Administered 2012-04-30: 1 via ORAL
  Filled 2012-04-30: qty 1

## 2012-04-30 MED ORDER — HYDROCODONE-ACETAMINOPHEN 5-325 MG PO TABS: 2.0000 | ORAL_TABLET | ORAL | Status: DC | PRN

## 2012-04-30 NOTE — ED Provider Notes (Signed)
History  This chart was scribed for Yolanda Octave, MD by Yolanda Berg, ED Scribe. This patient was seen in room MH09/MH09 and the patient's care was started at 5:36 PM.  CSN: [id number]  Arrival date & time 04/30/12  1658   First MD Initiated Contact with Patient 04/30/12 1736      Chief Complaint  Patient presents with  . Sore    The history is provided by the patient. No language interpreter was used.    Yolanda Berg is a 27 y.o. female who presents to the Emergency Department complaining of 4 days of gradual onset, gradually worsening, constant skin sore to the right chin with associated swelling and pain that radiates in the anterior neck. She reports that she has prior episodes of similar sores in the same spot with a prior diagnosis of staph infection. She was seen by the dermatologist this morning for the same and had cultures taken. She reports that she was prescribe valtrix and took one dose this morning. She states that she has been prescribed valtrex before for the same with no improvement. She also reports that she took one dose of ibuprofen with no improvement in pain or swelling. She denies fever, sore throat, dental pain, emesis, trouble swallowing, CP and SOB as associated symptoms. She has a h/o Celiac disease and denies smoking and alcohol use.  Past Medical History  Diagnosis Date  . Celiac disease   . Staph infection     History reviewed. No pertinent past surgical history.  No family history on file.  History  Substance Use Topics  . Smoking status: Never Smoker   . Smokeless tobacco: Not on file  . Alcohol Use: No    No OB history provided.  Review of Systems  A complete 10 system review of systems was obtained and all systems are negative except as noted in the HPI and PMH.   Allergies  Review of patient's allergies indicates no known allergies.  Home Medications   Current Outpatient Rx  Name  Route  Sig  Dispense  Refill  . CLINDAMYCIN  HCL 300 MG PO CAPS   Oral   Take 300 mg by mouth 3 (three) times daily.         . ADULT MULTIVITAMIN W/MINERALS CH   Oral   Take 1 tablet by mouth daily.         Marland Kitchen MUPIROCIN 2 % EX OINT   Topical   Apply 1 application topically 3 (three) times daily.           Triage Vitals: BP 121/77  Pulse 120  Temp 98.5 F (36.9 C) (Oral)  Resp 16  Ht 5\' 9"  (1.753 m)  Wt 160 lb (72.576 kg)  BMI 23.63 kg/m2  SpO2 100%  LMP 04/03/2012  Physical Exam  Nursing note and vitals reviewed. Constitutional: She is oriented to person, place, and time. She appears well-developed and well-nourished. No distress.  HENT:  Head: Normocephalic and atraumatic.       erythematous vesicular skin lesion to the right chin, no erythema or exudate to the oropharynx, dentition intact, floor of mouth is soft, no difficulty handling secretions  Eyes: Conjunctivae normal and EOM are normal. Pupils are equal, round, and reactive to light.  Neck: Neck supple. No tracheal deviation present.       palpable submandibular lymph nodes   Cardiovascular: Normal rate and regular rhythm.   Pulmonary/Chest: Effort normal and breath sounds normal. No respiratory distress.  Abdominal: Soft. There  is no tenderness.  Musculoskeletal: Normal range of motion. She exhibits no edema.  Lymphadenopathy:    She has cervical adenopathy.  Neurological: She is alert and oriented to person, place, and time.  Skin: Skin is warm and dry.  Psychiatric: She has a normal mood and affect. Her behavior is normal.    ED Course  Procedures (including critical care time)  DIAGNOSTIC STUDIES: Oxygen Saturation is 100% on room air, normal by my interpretation.    COORDINATION OF CARE: 5:48 PM-Discussed treatment plan which includes neck XR, rapid strep, pain medications and keflex with pt at bedside and pt agreed to plan. Pt is requesting IV antibiotics; however, I informed pt that oral is the best treatment  6:43 PM-Pt rechecked and  feels improved. Informed pt of negative radiology report and discharge plan. Pt agrees at this time to the plan.   Labs Reviewed  RAPID STREP SCREEN   Dg Neck Soft Tissue  04/30/2012  *RADIOLOGY REPORT*  Clinical Data: Swollen glands and history of staph infection.  NECK SOFT TISSUES - 1+ VIEW  Comparison: None.  Findings: Single lateral view of the neck was obtained.  Normal appearance of the prevertebral soft tissues.  Normal alignment of the cervical spine.  Epiglottis is not well visualized but there is no a gross abnormality to the epiglottis or aryepiglottic folds.  IMPRESSION: Negative soft tissue neck examination.   Original Report Authenticated By: Richarda Overlie, M.D.      No diagnosis found.    MDM  Erythematous sore to her chin for the past several days. History of same with recurrent staph infections. Associated with swelling of her chin and submandibular area. No difficulty breathing or swallowing. No fever or vomiting. No chest pain or shortness of breath. Started on Valtrex by dermatologist today.  , No trismus, mild submental edema. Palpable lymph nodes. Floor of mouth soft.  Airway patent, mild submental swelling with lymph node. Patient requested IV antibiotics. I explained that they are not indicated and by mouth would be sufficient. She will continue her antivirals and follow up with a dermatologist.  I personally performed the services described in this documentation, which was scribed in my presence. The recorded information has been reviewed and is accurate.      Yolanda Octave, MD 04/30/12 2026

## 2012-04-30 NOTE — ED Notes (Addendum)
Pt c/o sore to chin and soreness to neck x 4 days. Pt sts she saw dermatologist this morning for same. Pt sts pain has worsened.

## 2013-03-21 ENCOUNTER — Emergency Department (HOSPITAL_BASED_OUTPATIENT_CLINIC_OR_DEPARTMENT_OTHER)
Admission: EM | Admit: 2013-03-21 | Discharge: 2013-03-21 | Disposition: A | Discharge status: Home / Self Care | Payer: BC Managed Care – PPO | Attending: Emergency Medicine | Admitting: Emergency Medicine

## 2013-03-21 ENCOUNTER — Encounter (HOSPITAL_BASED_OUTPATIENT_CLINIC_OR_DEPARTMENT_OTHER): Payer: Self-pay | Admitting: Emergency Medicine

## 2013-03-21 ENCOUNTER — Emergency Department (HOSPITAL_BASED_OUTPATIENT_CLINIC_OR_DEPARTMENT_OTHER): Payer: BC Managed Care – PPO

## 2013-03-21 DIAGNOSIS — R51 Headache: Secondary | ICD-10-CM | POA: Insufficient documentation

## 2013-03-21 DIAGNOSIS — M545 Low back pain, unspecified: Secondary | ICD-10-CM | POA: Insufficient documentation

## 2013-03-21 DIAGNOSIS — R000 Tachycardia, unspecified: Secondary | ICD-10-CM | POA: Insufficient documentation

## 2013-03-21 DIAGNOSIS — R509 Fever, unspecified: Secondary | ICD-10-CM | POA: Insufficient documentation

## 2013-03-21 DIAGNOSIS — J029 Acute pharyngitis, unspecified: Secondary | ICD-10-CM | POA: Insufficient documentation

## 2013-03-21 DIAGNOSIS — J3489 Other specified disorders of nose and nasal sinuses: Secondary | ICD-10-CM | POA: Insufficient documentation

## 2013-03-21 DIAGNOSIS — Z8719 Personal history of other diseases of the digestive system: Secondary | ICD-10-CM | POA: Insufficient documentation

## 2013-03-21 DIAGNOSIS — R6889 Other general symptoms and signs: Secondary | ICD-10-CM

## 2013-03-21 DIAGNOSIS — R059 Cough, unspecified: Secondary | ICD-10-CM | POA: Insufficient documentation

## 2013-03-21 DIAGNOSIS — M79609 Pain in unspecified limb: Secondary | ICD-10-CM | POA: Insufficient documentation

## 2013-03-21 DIAGNOSIS — Z8619 Personal history of other infectious and parasitic diseases: Secondary | ICD-10-CM | POA: Insufficient documentation

## 2013-03-21 DIAGNOSIS — Z3202 Encounter for pregnancy test, result negative: Secondary | ICD-10-CM | POA: Insufficient documentation

## 2013-03-21 DIAGNOSIS — R05 Cough: Secondary | ICD-10-CM | POA: Insufficient documentation

## 2013-03-21 LAB — COMPREHENSIVE METABOLIC PANEL WITH GFR
ALT: 11 U/L (ref 0–35)
AST: 19 U/L (ref 0–37)
Albumin: 3.9 g/dL (ref 3.5–5.2)
Alkaline Phosphatase: 53 U/L (ref 39–117)
BUN: 7 mg/dL (ref 6–23)
CO2: 25 meq/L (ref 19–32)
Calcium: 9 mg/dL (ref 8.4–10.5)
Chloride: 102 meq/L (ref 96–112)
Creatinine, Ser: 0.6 mg/dL (ref 0.50–1.10)
GFR calc Af Amer: >90 mL/min
GFR calc non Af Amer: >90 mL/min
Glucose, Bld: 99 mg/dL (ref 70–99)
Potassium: 3.7 meq/L (ref 3.5–5.1)
Sodium: 137 meq/L (ref 135–145)
Total Bilirubin: 0.3 mg/dL (ref 0.3–1.2)
Total Protein: 7.2 g/dL (ref 6.0–8.3)

## 2013-03-21 LAB — CBC WITH DIFFERENTIAL/PLATELET
Basophils Absolute: 0 10*3/uL (ref 0.0–0.1)
Basophils Relative: 1 % (ref 0–1)
Eosinophils Absolute: 0 10*3/uL (ref 0.0–0.7)
Eosinophils Relative: 0 % (ref 0–5)
HCT: 37.2 % (ref 36.0–46.0)
Hemoglobin: 12.5 g/dL (ref 12.0–15.0)
Lymphocytes Relative: 13 % (ref 12–46)
Lymphs Abs: 0.8 10*3/uL (ref 0.7–4.0)
MCH: 29.4 pg (ref 26.0–34.0)
MCHC: 33.6 g/dL (ref 30.0–36.0)
MCV: 87.5 fL (ref 78.0–100.0)
Monocytes Absolute: 0.8 10*3/uL (ref 0.1–1.0)
Monocytes Relative: 12 % (ref 3–12)
Neutro Abs: 4.5 10*3/uL (ref 1.7–7.7)
Neutrophils Relative %: 74 % (ref 43–77)
Platelets: 123 10*3/uL — ABNORMAL LOW (ref 150–400)
RBC: 4.25 MIL/uL (ref 3.87–5.11)
RDW: 12.1 % (ref 11.5–15.5)
WBC: 6 10*3/uL (ref 4.0–10.5)

## 2013-03-21 LAB — PREGNANCY, URINE: Preg Test, Ur: NEGATIVE

## 2013-03-21 LAB — URINE MICROSCOPIC-ADD ON

## 2013-03-21 LAB — URINALYSIS, ROUTINE W REFLEX MICROSCOPIC
Glucose, UA: NEGATIVE mg/dL
Hgb urine dipstick: NEGATIVE
Specific Gravity, Urine: 1.015 (ref 1.005–1.030)
pH: 7 (ref 5.0–8.0)

## 2013-03-21 MED ORDER — MELOXICAM 7.5 MG PO TABS: 7.5000 mg | ORAL_TABLET | Freq: Every day | ORAL | Status: DC

## 2013-03-21 MED ORDER — SODIUM CHLORIDE 0.9 % IV SOLN
INTRAVENOUS | Status: DC
  Administered 2013-03-21: 14:00:00 via INTRAVENOUS

## 2013-03-21 MED ORDER — HYDROMORPHONE HCL PF 1 MG/ML IJ SOLN
1.0000 mg | Freq: Once | INTRAMUSCULAR | Status: AC
  Administered 2013-03-21: 1 mg via INTRAVENOUS
  Filled 2013-03-21: qty 1

## 2013-03-21 MED ORDER — ACETAMINOPHEN 325 MG PO TABS
650.0000 mg | ORAL_TABLET | Freq: Once | ORAL | Status: AC
  Administered 2013-03-21: 650 mg via ORAL
  Filled 2013-03-21: qty 2

## 2013-03-21 NOTE — ED Notes (Signed)
Patient here with lower back pain x 2 weeks, reports that she thinks she may have injured at gym, pain radiating down legs. Patient also now has cold, congestion, fever and headache x 3 days with chills.

## 2013-03-21 NOTE — ED Provider Notes (Signed)
CSN: [id number]     Arrival date & time 03/21/13  1135 History   First MD Initiated Contact with Patient 03/21/13 1248     Chief Complaint  Patient presents with  . Back Pain  . Nasal Congestion   (Consider location/radiation/quality/duration/timing/severity/associated sxs/prior Treatment) Patient is a 27 y.o. female presenting with back pain. The history is provided by the patient.  Back Pain Location:  Lumbar spine Quality:  Aching Radiates to:  L posterior upper leg and R posterior upper leg Pain severity:  Moderate Pain is:  Same all the time Timing:  Constant Chronicity:  New Worsened by:  Ambulation, movement, standing, twisting and touching Ineffective treatments: muscle relaxants. Associated symptoms: fever, headaches and leg pain   Associated symptoms: no abdominal pain, no bladder incontinence, no bowel incontinence and no dysuria    Yolanda Berg is a 27 y.o. female who presents to the ED with low back pain that started 2 weeks ago. She saw her PCP and was treated with muscle relaxants and she has gotten no relief. Now the pain is worse and she has fever, chills and headache. She feels achy all over but the back pain is severe.   Past Medical History  Diagnosis Date  . Celiac disease   . Staph infection    History reviewed. No pertinent past surgical history. No family history on file. History  Substance Use Topics  . Smoking status: Never Smoker   . Smokeless tobacco: Not on file  . Alcohol Use: No   OB History   Grav Para Term Preterm Abortions TAB SAB Ect Mult Living                 Review of Systems  Constitutional: Positive for fever and chills.  HENT: Positive for congestion and sore throat.   Respiratory: Positive for cough. Negative for shortness of breath and wheezing.   Cardiovascular: Negative for palpitations.  Gastrointestinal: Negative for nausea, vomiting, abdominal pain and bowel incontinence.  Genitourinary: Negative for bladder  incontinence, dysuria, urgency, frequency, vaginal bleeding and vaginal discharge.  Musculoskeletal: Positive for back pain.  Skin: Negative for rash.  Neurological: Positive for headaches. Negative for syncope and light-headedness.  Psychiatric/Behavioral: The patient is not nervous/anxious.     Allergies  Review of patient's allergies indicates no known allergies.  Home Medications  No current outpatient prescriptions on file. BP 125/80  Pulse 113  Temp(Src) 99 F (37.2 C) (Oral)  Resp 18  SpO2 98% Physical Exam  Nursing note and vitals reviewed. Constitutional: She is oriented to person, place, and time. She appears well-developed and well-nourished.  HENT:  Head: Normocephalic and atraumatic.  Eyes: EOM are normal. Right conjunctiva is injected. Left conjunctiva is injected.  Neck: Neck supple.  Cardiovascular: Intact distal pulses.  Tachycardia present.   Pulmonary/Chest: Effort normal and breath sounds normal.  Abdominal: Soft. Bowel sounds are normal. There is no tenderness.  Musculoskeletal:       Lumbar back: She exhibits decreased range of motion, tenderness and spasm. She exhibits normal pulse.  Pedal pulses equal, adequate circulation, good touch sensation.  Neurological: She is alert and oriented to person, place, and time. She has normal strength and normal reflexes. No cranial nerve deficit or sensory deficit. She displays a negative Romberg sign. Gait normal.  Skin: Skin is warm and dry.  Psychiatric: She has a normal mood and affect. Her behavior is normal.    Results for orders placed during the hospital encounter of 03/21/13 (from the past  24 hour(s))  URINALYSIS, ROUTINE W REFLEX MICROSCOPIC     Status: Abnormal   Collection Time    03/21/13 12:05 PM      Result Value Range   Color, Urine YELLOW  YELLOW   APPearance CLEAR  CLEAR   Specific Gravity, Urine 1.015  1.005 - 1.030   pH 7.0  5.0 - 8.0   Glucose, UA NEGATIVE  NEGATIVE mg/dL   Hgb urine  dipstick NEGATIVE  NEGATIVE   Bilirubin Urine NEGATIVE  NEGATIVE   Ketones, ur 40 (*) NEGATIVE mg/dL   Protein, ur NEGATIVE  NEGATIVE mg/dL   Urobilinogen, UA 0.2  0.0 - 1.0 mg/dL   Nitrite NEGATIVE  NEGATIVE   Leukocytes, UA TRACE (*) NEGATIVE  PREGNANCY, URINE     Status: None   Collection Time    03/21/13 12:05 PM      Result Value Range   Preg Test, Ur NEGATIVE  NEGATIVE  URINE MICROSCOPIC-ADD ON     Status: Abnormal   Collection Time    03/21/13 12:05 PM      Result Value Range   Squamous Epithelial / LPF RARE  RARE   WBC, UA 3-6  <3 WBC/hpf   Bacteria, UA MANY (*) RARE   Urine-Other MUCOUS PRESENT    CBC WITH DIFFERENTIAL     Status: Abnormal   Collection Time    03/21/13  1:35 PM      Result Value Range   WBC 6.0  4.0 - 10.5 K/uL   RBC 4.25  3.87 - 5.11 MIL/uL   Hemoglobin 12.5  12.0 - 15.0 g/dL   HCT 14.7  82.9 - 56.2 %   MCV 87.5  78.0 - 100.0 fL   MCH 29.4  26.0 - 34.0 pg   MCHC 33.6  30.0 - 36.0 g/dL   RDW 13.0  86.5 - 78.4 %   Platelets 123 (*) 150 - 400 K/uL   Neutrophils Relative % 74  43 - 77 %   Neutro Abs 4.5  1.7 - 7.7 K/uL   Lymphocytes Relative 13  12 - 46 %   Lymphs Abs 0.8  0.7 - 4.0 K/uL   Monocytes Relative 12  3 - 12 %   Monocytes Absolute 0.8  0.1 - 1.0 K/uL   Eosinophils Relative 0  0 - 5 %   Eosinophils Absolute 0.0  0.0 - 0.7 K/uL   Basophils Relative 1  0 - 1 %   Basophils Absolute 0.0  0.0 - 0.1 K/uL  COMPREHENSIVE METABOLIC PANEL     Status: None   Collection Time    03/21/13  1:35 PM      Result Value Range   Sodium 137  135 - 145 mEq/L   Potassium 3.7  3.5 - 5.1 mEq/L   Chloride 102  96 - 112 mEq/L   CO2 25  19 - 32 mEq/L   Glucose, Bld 99  70 - 99 mg/dL   BUN 7  6 - 23 mg/dL   Creatinine, Ser 6.96  0.50 - 1.10 mg/dL   Calcium 9.0  8.4 - 29.5 mg/dL   Total Protein 7.2  6.0 - 8.3 g/dL   Albumin 3.9  3.5 - 5.2 g/dL   AST 19  0 - 37 U/L   ALT 11  0 - 35 U/L   Alkaline Phosphatase 53  39 - 117 U/L   Total Bilirubin 0.3  0.3 -  1.2 mg/dL   GFR calc non Af Amer >90  >  90 mL/min   GFR calc Af Amer >90  >90 mL/min    ED Course  Procedures (including critical care time) Labs Review Mr Lumbar Spine Wo Contrast  03/21/2013   CLINICAL DATA:  Back pain. Dull low back pain shooting into the legs. Right-sided worse than left-sided symptoms. Progressive worsening.  EXAM: MRI LUMBAR SPINE WITHOUT CONTRAST  TECHNIQUE: Multiplanar, multisequence MR imaging was performed. No intravenous contrast was administered.  COMPARISON:  CT 01/01/2012.  FINDINGS: The numbering convention used for this exam termed L5-S1 as the last intervertebral disc space. Vertebral body height and marrow signal are within normal limits. The paraspinal soft tissues are normal. The spinal cord terminates posterior to the L1-L2 interspace.  Intervertebral discs are normal aside from L4-L5.  L4-L5 shows disc desiccation. There is a central disc protrusion that produces mild central stenosis. This narrows both lateral recesses, right greater than left, potentially affecting both descending L5 nerves. The neural foramina are patent. Central stenosis is mild.  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   Electronically Signed   By: Andreas Newport M.D.   On: 03/21/2013 14:47    EKG Interpretation   None       MDM: I discussed this patient with Dr. Fonnie Jarvis.   27 y.o. female with flu like symptoms, fever and progressing worse back pain that radiates to both legs. No signs of epidural abscess on MRI. Will treat for flu like symptoms. Will not give tamiflu since it has been over 3 days of symptoms. She is to continue her muscle relaxant and take NSAIDS. Stable for discharge without any immediate complications. Normal neuro exam. Temp responded to tylenol.  BP 99/52  Pulse 82  Temp(Src) 98.5 F (36.9 C) (Oral)  Resp 18  SpO2 98%     Janne Napoleon, NP 03/22/13  [phone]

## 2013-03-21 NOTE — ED Provider Notes (Signed)
Medical screening examination/treatment/procedure(s) were performed by non-physician practitioner and as supervising physician I was immediately available for consultation/collaboration.  Jaqulyn Chancellor M Kirsty Monjaraz, MD 03/21/13 2012 

## 2013-04-09 NOTE — L&D Delivery Note (Signed)
Delivery Note At 3:48 PM a viable female was delivered via Vaginal, Spontaneous Delivery (Presentation: Left Occiput Anterior).  APGAR: 9, 9; weight pending.   Placenta status: Intact, Spontaneous.  Cord: 3 vessels with the following complications: None.  Anesthesia: Epidural  Episiotomy: None Lacerations: 2nd degree;Sulcus-Bilateral Suture Repair: 3.0 vicryl rapide, 2-0 Vicryl Est. Blood Loss (mL): 400  Mom to postpartum.  Baby to Couplet care / Skin to Skin.  Pamela Maddy D 12/07/2013, 4:21 PM

## 2013-04-19 ENCOUNTER — Emergency Department (HOSPITAL_COMMUNITY)
Admission: EM | Admit: 2013-04-19 | Discharge: 2013-04-20 | Disposition: A | Discharge status: Home / Self Care | Payer: BC Managed Care – PPO | Attending: Emergency Medicine | Admitting: Emergency Medicine

## 2013-04-19 ENCOUNTER — Encounter (HOSPITAL_COMMUNITY): Payer: Self-pay | Admitting: Emergency Medicine

## 2013-04-19 ENCOUNTER — Emergency Department (HOSPITAL_COMMUNITY): Payer: BC Managed Care – PPO

## 2013-04-19 DIAGNOSIS — R109 Unspecified abdominal pain: Secondary | ICD-10-CM | POA: Insufficient documentation

## 2013-04-19 DIAGNOSIS — Z8619 Personal history of other infectious and parasitic diseases: Secondary | ICD-10-CM | POA: Insufficient documentation

## 2013-04-19 DIAGNOSIS — R350 Frequency of micturition: Secondary | ICD-10-CM | POA: Insufficient documentation

## 2013-04-19 DIAGNOSIS — R110 Nausea: Secondary | ICD-10-CM | POA: Insufficient documentation

## 2013-04-19 DIAGNOSIS — R102 Pelvic and perineal pain: Secondary | ICD-10-CM

## 2013-04-19 DIAGNOSIS — Z8719 Personal history of other diseases of the digestive system: Secondary | ICD-10-CM | POA: Insufficient documentation

## 2013-04-19 DIAGNOSIS — R3915 Urgency of urination: Secondary | ICD-10-CM | POA: Insufficient documentation

## 2013-04-19 DIAGNOSIS — Z3490 Encounter for supervision of normal pregnancy, unspecified, unspecified trimester: Secondary | ICD-10-CM

## 2013-04-19 DIAGNOSIS — N949 Unspecified condition associated with female genital organs and menstrual cycle: Secondary | ICD-10-CM | POA: Insufficient documentation

## 2013-04-19 DIAGNOSIS — O9989 Other specified diseases and conditions complicating pregnancy, childbirth and the puerperium: Secondary | ICD-10-CM | POA: Insufficient documentation

## 2013-04-19 LAB — BASIC METABOLIC PANEL
BUN: 12 mg/dL (ref 6–23)
CHLORIDE: 95 meq/L — AB (ref 96–112)
CO2: 23 mEq/L (ref 19–32)
Calcium: 9.6 mg/dL (ref 8.4–10.5)
Creatinine, Ser: 0.54 mg/dL (ref 0.50–1.10)
GFR calc Af Amer: >90 mL/min (ref >90)
GLUCOSE: 102 mg/dL — AB (ref 70–99)
POTASSIUM: 4 meq/L (ref 3.7–5.3)
SODIUM: 134 meq/L — AB (ref 137–147)

## 2013-04-19 LAB — ABO/RH: ABO/RH(D): O POS

## 2013-04-19 LAB — URINALYSIS, ROUTINE W REFLEX MICROSCOPIC
BILIRUBIN URINE: NEGATIVE
GLUCOSE, UA: NEGATIVE mg/dL
Hgb urine dipstick: NEGATIVE
Ketones, ur: NEGATIVE mg/dL
Leukocytes, UA: NEGATIVE
Nitrite: NEGATIVE
PROTEIN: NEGATIVE mg/dL
Specific Gravity, Urine: 1.011 (ref 1.005–1.030)
Urobilinogen, UA: 0.2 mg/dL (ref 0.0–1.0)
pH: 7 (ref 5.0–8.0)

## 2013-04-19 LAB — CBC
HCT: 39.8 % (ref 36.0–46.0)
Hemoglobin: 13.7 g/dL (ref 12.0–15.0)
MCH: 29.4 pg (ref 26.0–34.0)
MCHC: 34.4 g/dL (ref 30.0–36.0)
MCV: 85.4 fL (ref 78.0–100.0)
Platelets: 167 10*3/uL (ref 150–400)
RBC: 4.66 MIL/uL (ref 3.87–5.11)
RDW: 13.5 % (ref 11.5–15.5)
WBC: 9.4 10*3/uL (ref 4.0–10.5)

## 2013-04-19 LAB — WET PREP, GENITAL
CLUE CELLS WET PREP: NONE SEEN
Trich, Wet Prep: NONE SEEN
Yeast Wet Prep HPF POC: NONE SEEN

## 2013-04-19 LAB — POCT PREGNANCY, URINE: PREG TEST UR: POSITIVE — AB

## 2013-04-19 NOTE — ED Provider Notes (Signed)
CSN: [id number]     Arrival date & time 04/19/13  2110 History   First MD Initiated Contact with Patient 04/19/13 2141     Chief Complaint  Patient presents with  . Pelvic Pain   (Consider location/radiation/quality/duration/timing/severity/associated sxs/prior Treatment) The history is provided by the patient and medical records. No language interpreter was used.    Yolanda Berg is a 28 y.o. female  with a hx of celiac disease, G1P0 presents to the Emergency Department complaining of gradual, persistent, progressively worsening suprapubic abdominal pain beginning yesterday with associated urinary frequency and urgency for several days. Patient reports her last menstrual cycle was on 03/06/2013. Last week she had a positive home pregnancy test. She has an appointment to see her OB/GYN in 2 days.  She has not tried any over-the-counter treatments. Nothing seems to make symptoms better or worse. She denies fever, chills, headache and neck pain, chest pain, vomiting, diarrhea, weakness, dizziness, syncope, dysuria, vaginal bleeding.       Past Medical History  Diagnosis Date  . Celiac disease   . Staph infection    History reviewed. No pertinent past surgical history. History reviewed. No pertinent family history. History  Substance Use Topics  . Smoking status: Never Smoker   . Smokeless tobacco: Not on file  . Alcohol Use: No   OB History   Grav Para Term Preterm Abortions TAB SAB Ect Mult Living   1              Review of Systems  Constitutional: Negative for fever, diaphoresis, appetite change, fatigue and unexpected weight change.  HENT: Negative for mouth sores and trouble swallowing.   Respiratory: Negative for cough, chest tightness, shortness of breath, wheezing and stridor.   Cardiovascular: Negative for chest pain and palpitations.  Gastrointestinal: Positive for nausea and abdominal pain (suprapubic). Negative for vomiting, diarrhea, constipation, blood in stool,  abdominal distention and rectal pain.  Genitourinary: Positive for urgency and frequency. Negative for dysuria, hematuria, flank pain and difficulty urinating.  Musculoskeletal: Negative for back pain, neck pain and neck stiffness.  Skin: Negative for rash.  Neurological: Negative for weakness.  Hematological: Negative for adenopathy.  Psychiatric/Behavioral: Negative for confusion.  All other systems reviewed and are negative.    Allergies  Review of patient's allergies indicates no known allergies.  Home Medications  No current outpatient prescriptions on file. BP 121/75  Pulse 79  Temp(Src) 98.6 F (37 C) (Oral)  Resp 18  Ht 5\' 9"  (1.753 m)  Wt 156 lb (70.761 kg)  BMI 23.03 kg/m2  SpO2 100%  LMP 04/03/2012 Physical Exam  Nursing note and vitals reviewed. Constitutional: She appears well-developed and well-nourished. No distress.  Awake, alert, nontoxic appearance  HENT:  Head: Normocephalic and atraumatic.  Mouth/Throat: Oropharynx is clear and moist. No oropharyngeal exudate.  Eyes: Conjunctivae are normal. No scleral icterus.  Neck: Normal range of motion. Neck supple.  Cardiovascular: Normal rate, regular rhythm, normal heart sounds and intact distal pulses.   Regular Rate and rhythm No murmur  Pulmonary/Chest: Effort normal and breath sounds normal. No respiratory distress. She has no wheezes.  Clear and equal breath sounds  Abdominal: Soft. Bowel sounds are normal. She exhibits no distension and no mass. There is tenderness in the suprapubic area. There is no rebound and no guarding.  Mild suprapubic tenderness No CVA tenderness  Musculoskeletal: Normal range of motion. She exhibits no edema.  Lymphadenopathy:    She has no cervical adenopathy.  Neurological: She is alert.  Speech is clear and goal oriented Moves extremities without ataxia  Skin: Skin is warm and dry. She is not diaphoretic.  Psychiatric: She has a normal mood and affect.    ED Course   Procedures (including critical care time) Labs Review Labs Reviewed  WET PREP, GENITAL - Abnormal; Notable for the following:    WBC, Wet Prep HPF POC FEW (*)    All other components within normal limits  URINALYSIS, ROUTINE W REFLEX MICROSCOPIC - Abnormal; Notable for the following:    APPearance CLOUDY (*)    All other components within normal limits  BASIC METABOLIC PANEL - Abnormal; Notable for the following:    Sodium 134 (*)    Chloride 95 (*)    Glucose, Bld 102 (*)    All other components within normal limits  HCG, QUANTITATIVE, PREGNANCY - Abnormal; Notable for the following:    hCG, Beta Chain, Quant, S 17535 (*)    All other components within normal limits  POCT PREGNANCY, URINE - Abnormal; Notable for the following:    Preg Test, Ur POSITIVE (*)    All other components within normal limits  GC/CHLAMYDIA PROBE AMP  CBC  ABO/RH   Imaging Review [place] Ob Comp Less 14 Wks  04/20/2013   CLINICAL DATA:  28 year old G1, LMP 03/06/2013 (6 weeks 2 days), quantitative beta HCG pending. Patient presents with pelvic pain.  EXAM: OBSTETRIC <14 WK US AND TRANSVAGINAL OB US  TECHNIQUE: Both transabdominal and transvaginal ultrasound examinations were performed for complete evaluation of the gestation as well as the maternal uterus, adnexal regions, and pelvic cul-de-sac. Transvaginal technique was performed to assess early pregnancy.  COMPARISON:  None.  FINDINGS: Intrauterine gestational sac: Single, normal in appearance.  Yolk sac:  Visualized.  Embryo:  Visualized.  Cardiac Activity: Visualized.  Heart Rate:  114 bpm  CRL:   7.2  mm   6 w 4 d                  US EDC: 12/09/2013.  Maternal uterus/adnexae: Very small subchorionic hemorrhage. Retroflexed uterus. Normal-appearing right ovary measuring approximately 2.2 x 0.8 x 2.1 cm. Mildly enlarged left ovary measuring approximately 3.9 x 4.1 x 3.8 cm, containing an approximate 2.8 x 1.9 x 2.2 cm hemorrhagic corpus luteum cyst. No other  adnexal masses or free pelvic fluid.  IMPRESSION: 1. Single live intrauterine embryo with estimated gestational age [redacted] weeks 4 days by crown-rump length, correlating well with the estimated gestational age of [redacted] weeks 2 days by LMP. [facility] 2. Very small subchorionic hemorrhage. 3. Approximate 3 cm hemorrhagic corpus luteum cyst in the left ovary.   Electronically Signed   By: Hulan Saashomas  Lawrence M.D.   On: 04/20/2013 00:07   [place] Ob Transvaginal  04/20/2013   CLINICAL DATA:  28 year old G1, LMP 03/06/2013 (6 weeks 2 days), quantitative beta HCG pending. Patient presents with pelvic pain.  EXAM: OBSTETRIC <14 WK US AND TRANSVAGINAL OB US  TECHNIQUE: Both transabdominal and transvaginal ultrasound examinations were performed for complete evaluation of the gestation as well as the maternal uterus, adnexal regions, and pelvic cul-de-sac. Transvaginal technique was performed to assess early pregnancy.  COMPARISON:  None.  FINDINGS: Intrauterine gestational sac: Single, normal in appearance.  Yolk sac:  Visualized.  Embryo:  Visualized.  Cardiac Activity: Visualized.  Heart Rate:  114 bpm  CRL:   7.2  mm   6 w 4 d  [place] EDC: 12/09/2013.  Maternal uterus/adnexae: Very small subchorionic hemorrhage. Retroflexed uterus. Normal-appearing right ovary measuring approximately 2.2 x 0.8 x 2.1 cm. Mildly enlarged left ovary measuring approximately 3.9 x 4.1 x 3.8 cm, containing an approximate 2.8 x 1.9 x 2.2 cm hemorrhagic corpus luteum cyst. No other adnexal masses or free pelvic fluid.  IMPRESSION: 1. Single live intrauterine embryo with estimated gestational age [redacted] weeks 4 days by crown-rump length, correlating well with the estimated gestational age of [redacted] weeks 2 days by LMP. Ultrasound [facility] 12/09/2013. 2. Very small subchorionic hemorrhage. 3. Approximate 3 cm hemorrhagic corpus luteum cyst in the left ovary.   Electronically Signed   By: Hulan Saas M.D.   On: 04/20/2013 00:07    EKG  Interpretation   None       MDM   1. Pelvic pain   2. Pregnancy      Marciel Offenberger presents with pregnancy test and lower abdominal pain with urinary frequency and urgency. Patient has had no prenatal care up to this point. We'll obtain labs, pelvic exam and ultrasound.  12:10 AM CBC, BMP unremarkable. Patient Rh+ no need for RhoGAM.  Ultrasound with single IUP at 6 weeks and one day. Very small subchorionic hemorrhage. Approximate 3 cm hemorrhagic corpus luteum cyst in the left ovary.  Patient given strict instructions to followup with her OB/GYN in 2 days as scheduled. Also given reasons to return here to the emergency department.  She is alert, oriented, nontoxic and nonseptic appearing. Her vital signs are stable.  It has been determined that no acute conditions requiring further emergency intervention are present at this time. The patient/guardian have been advised of the diagnosis and plan. We have discussed signs and symptoms that warrant return to the ED, such as changes or worsening in symptoms.   Vital signs are stable at discharge.   BP 121/75  Pulse 79  Temp(Src) 98.6 F (37 C) (Oral)  Resp 18  Ht 5\' 9"  (1.753 m)  Wt 156 lb (70.761 kg)  BMI 23.03 kg/m2  SpO2 100%  LMP 04/03/2012  Patient/guardian has voiced understanding and agreed to follow-up with the PCP or specialist.      Dierdre Forth, PA-C 04/20/13 0041

## 2013-04-19 NOTE — ED Notes (Signed)
Pt arrived to the ED with a complaint of the possibility if a UTI.  Pt is complaining of urinary frequency, and lower pelvic pain.  Pt is [redacted] weeks pregnant.  Pt states she has been having the urinary frequency for several days but that the pelvic pain began yesterday.  Pt states she has some scant clear vaginal discharge.  Pt denies any blood in her urine

## 2013-04-20 LAB — GC/CHLAMYDIA PROBE AMP
CT PROBE, AMP APTIMA: NEGATIVE
GC PROBE AMP APTIMA: NEGATIVE

## 2013-04-20 LAB — HCG, QUANTITATIVE, PREGNANCY: HCG, BETA CHAIN, QUANT, S: 17535 m[IU]/mL — AB (ref <5)

## 2013-04-20 NOTE — ED Provider Notes (Signed)
Medical screening examination/treatment/procedure(s) were performed by non-physician practitioner and as supervising physician I was immediately available for consultation/collaboration.  Candyce ChurnJohn David Halden Phegley, MD 04/20/13 [phone]

## 2013-04-20 NOTE — ED Notes (Signed)
E-sign not working at this time.  

## 2013-04-20 NOTE — Discharge Instructions (Signed)
1. Medications: usual home medications 2. Treatment: rest, drink plenty of fluids,  3. Follow Up: Please followup with your OBGYN as scheduled   Abdominal Pain During Pregnancy Abdominal pain is common in pregnancy. Most of the time, it does not cause harm. There are many causes of abdominal pain. Some causes are more serious than others. Some of the causes of abdominal pain in pregnancy are easily diagnosed. Occasionally, the diagnosis takes time to understand. Other times, the cause is not determined. Abdominal pain can be a sign that something is very wrong with the pregnancy, or the pain may have nothing to do with the pregnancy at all. For this reason, always tell your health care provider if you have any abdominal discomfort. HOME CARE INSTRUCTIONS  Monitor your abdominal pain for any changes. The following actions may help to alleviate any discomfort you are experiencing:  Do not have sexual intercourse or put anything in your vagina until your symptoms go away completely.  Get plenty of rest until your pain improves.  Drink clear fluids if you feel nauseous. Avoid solid food as long as you are uncomfortable or nauseous.  Only take over-the-counter or prescription medicine as directed by your health care provider.  Keep all follow-up appointments with your health care provider. SEEK IMMEDIATE MEDICAL CARE IF:  You are bleeding, leaking fluid, or passing tissue from the vagina.  You have increasing pain or cramping.  You have persistent vomiting.  You have painful or bloody urination.  You have a fever.  You notice a decrease in your baby's movements.  You have extreme weakness or feel faint.  You have shortness of breath, with or without abdominal pain.  You develop a severe headache with abdominal pain.  You have abnormal vaginal discharge with abdominal pain.  You have persistent diarrhea.  You have abdominal pain that continues even after rest, or gets  worse. MAKE SURE YOU:   Understand these instructions.  Will watch your condition.  Will get help right away if you are not doing well or get worse. Document Released: 03/26/2005 Document Revised: 01/14/2013 Document Reviewed: 10/23/2012 [facility] [place] [facility] Patient Information 2014 [employer], [place].

## 2013-05-11 LAB — OB RESULTS CONSOLE HIV ANTIBODY (ROUTINE TESTING): HIV: NONREACTIVE

## 2013-05-11 LAB — OB RESULTS CONSOLE HEPATITIS B SURFACE ANTIGEN: HEP B S AG: NEGATIVE

## 2013-05-11 LAB — OB RESULTS CONSOLE RUBELLA ANTIBODY, IGM: RUBELLA: IMMUNE

## 2013-05-11 LAB — OB RESULTS CONSOLE RPR: RPR: NONREACTIVE

## 2013-05-11 LAB — OB RESULTS CONSOLE ANTIBODY SCREEN: ANTIBODY SCREEN: NEGATIVE

## 2013-05-12 LAB — OB RESULTS CONSOLE GC/CHLAMYDIA
CHLAMYDIA, DNA PROBE: NEGATIVE
Gonorrhea: NEGATIVE

## 2013-08-11 ENCOUNTER — Encounter (HOSPITAL_COMMUNITY): Payer: Self-pay | Admitting: *Deleted

## 2013-08-11 ENCOUNTER — Inpatient Hospital Stay (HOSPITAL_COMMUNITY)
Admission: AD | Admit: 2013-08-11 | Discharge: 2013-08-11 | Disposition: A | Discharge status: Home / Self Care | Payer: BC Managed Care – PPO | Source: Ambulatory Visit | Attending: Obstetrics and Gynecology | Admitting: Obstetrics and Gynecology

## 2013-08-11 DIAGNOSIS — O9989 Other specified diseases and conditions complicating pregnancy, childbirth and the puerperium: Secondary | ICD-10-CM

## 2013-08-11 DIAGNOSIS — N898 Other specified noninflammatory disorders of vagina: Secondary | ICD-10-CM | POA: Insufficient documentation

## 2013-08-11 DIAGNOSIS — O99891 Other specified diseases and conditions complicating pregnancy: Secondary | ICD-10-CM | POA: Insufficient documentation

## 2013-08-11 DIAGNOSIS — O26899 Other specified pregnancy related conditions, unspecified trimester: Secondary | ICD-10-CM

## 2013-08-11 LAB — AMNISURE RUPTURE OF MEMBRANE (ROM) NOT AT ARMC: Amnisure ROM: NEGATIVE

## 2013-08-11 NOTE — MAU Provider Note (Signed)
History     CSN: [id number]  Arrival date and time: 08/11/13 1722   First Provider Initiated Contact with Patient 08/11/13 1816      Chief Complaint  Patient presents with  . Vaginal Discharge    HPI  Ms. Yolanda Berg is a 28 y.o. female G1P0 at 5360w3d who presents with possible ROM. Around 1500 the patient was working out; she was walking at the park. Pt felt like she needed to urinate and while she was urinating she felt fluid coming from her vagina. She was walking to her car and she felt leaking into her panties. She went home and laid down and felt that the leaking started to taper off. The fluid was clear, no sign of bleeding. No pain described at the time. While she was walking she felt a pulling sensation. She currently rates her pain 0/10. She was sent by her Dr. For an amnisure test.   OB History   Grav Para Term Preterm Abortions TAB SAB Ect Mult Living   1               Past Medical History  Diagnosis Date  . Celiac disease   . Staph infection     Past Surgical History  Procedure Laterality Date  . Wisdom tooth extraction    . Pilonidal cyst / sinus excision    . Toe surgery      History reviewed. No pertinent family history.  History  Substance Use Topics  . Smoking status: Never Smoker   . Smokeless tobacco: Not on file  . Alcohol Use: No    Allergies: No Known Allergies  Prescriptions prior to admission  Medication Sig Dispense Refill  . calcium carbonate (TUMS - DOSED IN MG ELEMENTAL CALCIUM) 500 MG chewable tablet Chew 1 tablet by mouth as needed for indigestion or heartburn.      . loratadine (CLARITIN) 10 MG tablet Take 10 mg by mouth as needed for allergies.      . Prenatal Vit-Fe Fumarate-FA (PRENATAL MULTIVITAMIN) TABS tablet Take 1 tablet by mouth daily at 12 noon.       Results for orders placed during the hospital encounter of 08/11/13 (from the past 48 hour(s))  AMNISURE RUPTURE OF MEMBRANE (ROM)     Status: None   Collection Time   08/11/13  6:04 PM      Result Value Ref Range   Amnisure ROM NEGATIVE      Review of Systems  Constitutional: Negative for fever and chills.  Gastrointestinal: Negative for nausea, vomiting, abdominal pain, diarrhea and constipation.  Genitourinary: Negative for dysuria, urgency, frequency and hematuria.   Physical Exam   Blood pressure 108/58, pulse 77, temperature 98.5 F (36.9 C), temperature source Oral, resp. rate 18.  Physical Exam  Constitutional: She is oriented to person, place, and time. She appears well-developed and well-nourished. No distress.  HENT:  Head: Normocephalic.  Eyes: Pupils are equal, round, and reactive to light.  Neck: Neck supple.  Respiratory: Effort normal.  GI: Soft.  Musculoskeletal: Normal range of motion.  Neurological: She is alert and oriented to person, place, and time.  Skin: She is not diaphoretic.  Psychiatric: Her behavior is normal.    MAU Course  Procedures None  MDM Amnisure: order per Dr. Jackelyn KnifeMeisinger. If negative, ok to send patient home.  +fht 148 via doppler  Amnisure negative. Results discussed with patient and husband.   Assessment and Plan   A:  1. Vaginal discharge in pregnancy  P:  Discharge home in stable condition Return to MAU as needed, if symptoms worsen Follow up with your PCP as scheduled   .Iona HansenJennifer Irene Rasch, NP  08/11/2013, 6:16 PM

## 2013-08-11 NOTE — MAU Note (Signed)
tarted leaking clear watery fluid around 3, sent from office for further eval.

## 2013-11-12 LAB — OB RESULTS CONSOLE GBS: GBS: NEGATIVE

## 2013-12-07 ENCOUNTER — Encounter (HOSPITAL_COMMUNITY): Payer: BC Managed Care – PPO | Admitting: Anesthesiology

## 2013-12-07 ENCOUNTER — Encounter (HOSPITAL_COMMUNITY): Payer: Self-pay

## 2013-12-07 ENCOUNTER — Inpatient Hospital Stay (HOSPITAL_COMMUNITY)
Admission: RE | Admit: 2013-12-07 | Discharge: 2013-12-09 | DRG: 775 | Disposition: A | Discharge status: Home / Self Care | Payer: BC Managed Care – PPO | Source: Ambulatory Visit | Attending: Obstetrics and Gynecology | Admitting: Obstetrics and Gynecology

## 2013-12-07 ENCOUNTER — Inpatient Hospital Stay (HOSPITAL_COMMUNITY): Payer: BC Managed Care – PPO | Admitting: Anesthesiology

## 2013-12-07 DIAGNOSIS — O99891 Other specified diseases and conditions complicating pregnancy: Secondary | ICD-10-CM | POA: Diagnosis present

## 2013-12-07 DIAGNOSIS — Z3400 Encounter for supervision of normal first pregnancy, unspecified trimester: Secondary | ICD-10-CM

## 2013-12-07 DIAGNOSIS — K219 Gastro-esophageal reflux disease without esophagitis: Secondary | ICD-10-CM | POA: Diagnosis present

## 2013-12-07 LAB — SAMPLE TO BLOOD BANK

## 2013-12-07 LAB — CBC
HCT: 36.4 % (ref 36.0–46.0)
HEMOGLOBIN: 12.3 g/dL (ref 12.0–15.0)
MCH: 29.7 pg (ref 26.0–34.0)
MCHC: 33.8 g/dL (ref 30.0–36.0)
MCV: 87.9 fL (ref 78.0–100.0)
PLATELETS: 173 10*3/uL (ref 150–400)
RBC: 4.14 MIL/uL (ref 3.87–5.11)
RDW: 13.1 % (ref 11.5–15.5)
WBC: 8.1 10*3/uL (ref 4.0–10.5)

## 2013-12-07 LAB — RPR

## 2013-12-07 MED ORDER — METHYLERGONOVINE MALEATE 0.2 MG PO TABS: 0.2000 mg | ORAL_TABLET | ORAL | Status: DC | PRN

## 2013-12-07 MED ORDER — LACTATED RINGERS IV SOLN
INTRAVENOUS | Status: DC
  Administered 2013-12-07: 08:00:00 via INTRAVENOUS

## 2013-12-07 MED ORDER — DIBUCAINE 1 % RE OINT: 1.0000 "application " | TOPICAL_OINTMENT | RECTAL | Status: DC | PRN

## 2013-12-07 MED ORDER — EPHEDRINE 5 MG/ML INJ
10.0000 mg | INTRAVENOUS | Status: DC | PRN
  Filled 2013-12-07: qty 2

## 2013-12-07 MED ORDER — OXYTOCIN BOLUS FROM INFUSION
500.0000 mL | INTRAVENOUS | Status: DC
  Administered 2013-12-07: 500 mL via INTRAVENOUS

## 2013-12-07 MED ORDER — TERBUTALINE SULFATE 1 MG/ML IJ SOLN: 0.2500 mg | Freq: Once | INTRAMUSCULAR | Status: DC | PRN

## 2013-12-07 MED ORDER — MEASLES, MUMPS & RUBELLA VAC ~~LOC~~ INJ: 0.5000 mL | INJECTION | Freq: Once | SUBCUTANEOUS | Status: DC

## 2013-12-07 MED ORDER — LACTATED RINGERS IV SOLN: 500.0000 mL | INTRAVENOUS | Status: DC | PRN

## 2013-12-07 MED ORDER — DIPHENHYDRAMINE HCL 25 MG PO CAPS: 25.0000 mg | ORAL_CAPSULE | Freq: Four times a day (QID) | ORAL | Status: DC | PRN

## 2013-12-07 MED ORDER — LIDOCAINE HCL (PF) 1 % IJ SOLN
INTRAMUSCULAR | Status: DC | PRN
  Administered 2013-12-07 (×5): 4 mL

## 2013-12-07 MED ORDER — LACTATED RINGERS IV SOLN
500.0000 mL | Freq: Once | INTRAVENOUS | Status: AC
  Administered 2013-12-07: 500 mL via INTRAVENOUS

## 2013-12-07 MED ORDER — PHENYLEPHRINE 40 MCG/ML (10ML) SYRINGE FOR IV PUSH (FOR BLOOD PRESSURE SUPPORT)
80.0000 ug | PREFILLED_SYRINGE | INTRAVENOUS | Status: DC | PRN
  Filled 2013-12-07: qty 2

## 2013-12-07 MED ORDER — METHYLERGONOVINE MALEATE 0.2 MG/ML IJ SOLN: 0.2000 mg | INTRAMUSCULAR | Status: DC | PRN

## 2013-12-07 MED ORDER — SENNOSIDES-DOCUSATE SODIUM 8.6-50 MG PO TABS
2.0000 | ORAL_TABLET | ORAL | Status: DC
  Administered 2013-12-08 (×2): 2 via ORAL
  Filled 2013-12-07 (×2): qty 2

## 2013-12-07 MED ORDER — LANOLIN HYDROUS EX OINT: TOPICAL_OINTMENT | CUTANEOUS | Status: DC | PRN

## 2013-12-07 MED ORDER — ONDANSETRON HCL 4 MG/2ML IJ SOLN: 4.0000 mg | INTRAMUSCULAR | Status: DC | PRN

## 2013-12-07 MED ORDER — OXYTOCIN 40 UNITS IN LACTATED RINGERS INFUSION - SIMPLE MED
62.5000 mL/h | INTRAVENOUS | Status: DC
  Administered 2013-12-07: 62.5 mL/h via INTRAVENOUS

## 2013-12-07 MED ORDER — DIPHENHYDRAMINE HCL 50 MG/ML IJ SOLN: 12.5000 mg | INTRAMUSCULAR | Status: DC | PRN

## 2013-12-07 MED ORDER — TETANUS-DIPHTH-ACELL PERTUSSIS 5-2.5-18.5 LF-MCG/0.5 IM SUSP: 0.5000 mL | Freq: Once | INTRAMUSCULAR | Status: DC

## 2013-12-07 MED ORDER — IBUPROFEN 600 MG PO TABS
600.0000 mg | ORAL_TABLET | Freq: Four times a day (QID) | ORAL | Status: DC | PRN
  Administered 2013-12-07: 600 mg via ORAL
  Filled 2013-12-07 (×2): qty 1

## 2013-12-07 MED ORDER — BUTORPHANOL TARTRATE 1 MG/ML IJ SOLN
1.0000 mg | INTRAMUSCULAR | Status: DC | PRN
  Filled 2013-12-07: qty 1

## 2013-12-07 MED ORDER — ACETAMINOPHEN 325 MG PO TABS: 650.0000 mg | ORAL_TABLET | ORAL | Status: DC | PRN

## 2013-12-07 MED ORDER — WITCH HAZEL-GLYCERIN EX PADS: 1.0000 "application " | MEDICATED_PAD | CUTANEOUS | Status: DC | PRN

## 2013-12-07 MED ORDER — FENTANYL 2.5 MCG/ML BUPIVACAINE 1/10 % EPIDURAL INFUSION (WH - ANES)
14.0000 mL/h | INTRAMUSCULAR | Status: DC | PRN
  Administered 2013-12-07: 14 mL/h via EPIDURAL
  Filled 2013-12-07: qty 125

## 2013-12-07 MED ORDER — CITRIC ACID-SODIUM CITRATE 334-500 MG/5ML PO SOLN: 30.0000 mL | ORAL | Status: DC | PRN

## 2013-12-07 MED ORDER — OXYCODONE-ACETAMINOPHEN 5-325 MG PO TABS
1.0000 | ORAL_TABLET | ORAL | Status: DC | PRN
  Administered 2013-12-07: 2 via ORAL
  Filled 2013-12-07: qty 2

## 2013-12-07 MED ORDER — OXYTOCIN 40 UNITS IN LACTATED RINGERS INFUSION - SIMPLE MED
1.0000 m[IU]/min | INTRAVENOUS | Status: DC
  Administered 2013-12-07: 2 m[IU]/min via INTRAVENOUS
  Filled 2013-12-07: qty 1000

## 2013-12-07 MED ORDER — OXYCODONE-ACETAMINOPHEN 5-325 MG PO TABS
1.0000 | ORAL_TABLET | ORAL | Status: DC | PRN
  Administered 2013-12-07 – 2013-12-08 (×2): 2 via ORAL
  Administered 2013-12-08: 1 via ORAL
  Administered 2013-12-08 – 2013-12-09 (×3): 2 via ORAL
  Filled 2013-12-07: qty 2
  Filled 2013-12-07 (×2): qty 1
  Filled 2013-12-07 (×5): qty 2

## 2013-12-07 MED ORDER — SIMETHICONE 80 MG PO CHEW: 80.0000 mg | CHEWABLE_TABLET | ORAL | Status: DC | PRN

## 2013-12-07 MED ORDER — BENZOCAINE-MENTHOL 20-0.5 % EX AERO
1.0000 | INHALATION_SPRAY | CUTANEOUS | Status: DC | PRN
Start: 2013-12-07 — End: 2013-12-09
  Administered 2013-12-07 – 2013-12-08 (×2): 1 via TOPICAL
  Filled 2013-12-07 (×2): qty 56

## 2013-12-07 MED ORDER — IBUPROFEN 600 MG PO TABS
600.0000 mg | ORAL_TABLET | Freq: Four times a day (QID) | ORAL | Status: DC
  Administered 2013-12-08 – 2013-12-09 (×6): 600 mg via ORAL
  Filled 2013-12-07 (×7): qty 1

## 2013-12-07 MED ORDER — ONDANSETRON HCL 4 MG/2ML IJ SOLN: 4.0000 mg | Freq: Four times a day (QID) | INTRAMUSCULAR | Status: DC | PRN

## 2013-12-07 MED ORDER — ONDANSETRON HCL 4 MG PO TABS: 4.0000 mg | ORAL_TABLET | ORAL | Status: DC | PRN

## 2013-12-07 MED ORDER — PRENATAL MULTIVITAMIN CH
1.0000 | ORAL_TABLET | Freq: Every day | ORAL | Status: DC
  Administered 2013-12-08 – 2013-12-09 (×2): 1 via ORAL
  Filled 2013-12-07 (×2): qty 1

## 2013-12-07 MED ORDER — ZOLPIDEM TARTRATE 5 MG PO TABS: 5.0000 mg | ORAL_TABLET | Freq: Every evening | ORAL | Status: DC | PRN

## 2013-12-07 MED ORDER — PHENYLEPHRINE 40 MCG/ML (10ML) SYRINGE FOR IV PUSH (FOR BLOOD PRESSURE SUPPORT)
80.0000 ug | PREFILLED_SYRINGE | INTRAVENOUS | Status: DC | PRN
  Filled 2013-12-07: qty 2
  Filled 2013-12-07: qty 10

## 2013-12-07 MED ORDER — LIDOCAINE HCL (PF) 1 % IJ SOLN
30.0000 mL | INTRAMUSCULAR | Status: DC | PRN
  Filled 2013-12-07: qty 30

## 2013-12-07 MED ORDER — MAGNESIUM HYDROXIDE 400 MG/5ML PO SUSP
30.0000 mL | ORAL | Status: DC | PRN
Start: 2013-12-07 — End: 2013-12-09

## 2013-12-07 NOTE — Progress Notes (Signed)
Feeling ctx Afeb, VSS FHT- Cat I, ctx q 3 min VE-5/70/-1, vtx Continue pitocin, monitor progress

## 2013-12-07 NOTE — H&P (Signed)
Yolanda Berg is a 28 y.o. female, G1P0, EGA 39+ weeks with EDC 9-5 presenting for elective induction with favorable cervix.  Prenatal care essentially uncomplicated, see prenatal records for complete history.  Maternal Medical History:  Fetal activity: Perceived fetal activity is normal.      OB History   Grav Para Term Preterm Abortions TAB SAB Ect Mult Living   1              Past Medical History  Diagnosis Date  . Celiac disease   . Staph infection    Past Surgical History  Procedure Laterality Date  . Wisdom tooth extraction    . Pilonidal cyst / sinus excision    . Toe surgery     Family History: family history is not on file. Social History:  reports that she has never smoked. She does not have any smokeless tobacco history on file. She reports that she does not drink alcohol or use illicit drugs.   Prenatal Transfer Tool  Maternal Diabetes: No Genetic Screening: Normal Maternal Ultrasounds/Referrals: Normal Fetal Ultrasounds or other Referrals:  None Maternal Substance Abuse:  No Significant Maternal Medications:  None Significant Maternal Lab Results:  Lab values include: Group B Strep negative Other Comments:  None  Review of Systems  Respiratory: Negative.   Cardiovascular: Negative.    VE-3/50/-2, vtx, AROM clear   Blood pressure 118/81, pulse 85, temperature 98.2 F (36.8 C), temperature source Oral, resp. rate 18, height  (1.753 m), weight 93.441 kg (206 lb). Maternal Exam:  Abdomen: Patient reports no abdominal tenderness. Estimated fetal weight is 7 1/2 lbs.   Fetal presentation: vertex  Introitus: Normal vulva. Normal vagina.  Amniotic fluid character: clear.  Pelvis: adequate for delivery.   Cervix: Cervix evaluated by digital exam.     Fetal Exam Fetal Monitor Review: Mode: ultrasound.   Baseline rate: 130.  Variability: moderate (6-25 bpm).   Pattern: accelerations present and no decelerations.    Fetal State Assessment: Category I  - tracings are normal.     Physical Exam  Vitals reviewed. Constitutional: She appears well-developed and well-nourished.  Cardiovascular: Normal rate, regular rhythm and normal heart sounds.   No murmur heard. Respiratory: Effort normal and breath sounds normal. No respiratory distress. She has no wheezes.  GI: Soft.    Prenatal labs: ABO, Rh: --/--/O POS (01/11 2208) Antibody: Negative (02/02 0000) Rubella: Immune (02/02 0000) RPR: Nonreactive (02/02 0000)  HBsAg: Negative (02/02 0000)  HIV: Non-reactive (02/02 0000)  GBS: Negative (08/06 0000)  GCT:  134  Assessment/Plan: IUP at 39+ weeks with favorable cervix for induction.  On pitocin, AROM done, will monitor progress, anticipate SVD.   Fiza Nation D 12/07/2013, 8:10 AM

## 2013-12-07 NOTE — Anesthesia Preprocedure Evaluation (Signed)
Anesthesia Evaluation  Patient identified by MRN, date of birth, ID band Patient awake    Reviewed: Allergy & Precautions, H&P , NPO status , Patient's Chart, lab work & pertinent test results, reviewed documented beta blocker date and time   History of Anesthesia Complications Negative for: history of anesthetic complications  Airway Mallampati: II TM Distance: >3 FB Neck ROM: full    Dental  (+) Teeth Intact   Pulmonary neg pulmonary ROS,  breath sounds clear to auscultation        Cardiovascular negative cardio ROS  Rhythm:regular Rate:Normal     Neuro/Psych negative neurological ROS  negative psych ROS   GI/Hepatic Neg liver ROS, GERD-  Medicated,Celiac disease   Endo/Other  negative endocrine ROS  Renal/GU negative Renal ROS     Musculoskeletal   Abdominal   Peds  Hematology negative hematology ROS (+)   Anesthesia Other Findings   Reproductive/Obstetrics (+) Pregnancy                           Anesthesia Physical Anesthesia Plan  ASA: II  Anesthesia Plan: Epidural   Post-op Pain Management:    Induction:   Airway Management Planned:   Additional Equipment:   Intra-op Plan:   Post-operative Plan:   Informed Consent: I have reviewed the patients History and Physical, chart, labs and discussed the procedure including the risks, benefits and alternatives for the proposed anesthesia with the patient or authorized representative who has indicated his/her understanding and acceptance.     Plan Discussed with:   Anesthesia Plan Comments:         Anesthesia Quick Evaluation

## 2013-12-07 NOTE — Anesthesia Procedure Notes (Signed)
Epidural Patient location during procedure: OB Start time: 12/07/2013 2:46 PM  Staffing Anesthesiologist: Zaylyn Bergdoll Performed by: anesthesiologist   Preanesthetic Checklist Completed: patient identified, site marked, surgical consent, pre-op evaluation, timeout performed, IV checked, risks and benefits discussed and monitors and equipment checked  Epidural Patient position: sitting Prep: site prepped and draped and DuraPrep Patient monitoring: continuous pulse ox and blood pressure Approach: midline Location: L3-L4 Injection technique: LOR air  Needle:  Needle type: Tuohy  Needle gauge: 17 G Needle length: 9 cm and 9 Needle insertion depth: 5 cm cm Catheter type: closed end flexible Catheter size: 19 Gauge Catheter at skin depth: 10 cm Test dose: negative  Assessment Events: blood not aspirated, injection not painful, no injection resistance, negative IV test and no paresthesia  Additional Notes Discussed risk of headache, infection, bleeding, nerve injury and failed or incomplete block.  Patient voices understanding and wishes to proceed.  Epidural placed easily on first attempt.  No paresthesia.  Patient tolerated procedure well with no apparent complications.  Jasmine December, MDReason for block:procedure for pain

## 2013-12-08 NOTE — Lactation Note (Signed)
This note was copied from the chart of [name]. Lactation Consultation Note  Reviewed hand expression.  Mother was able to express drops of colostrum. Baby had a distinct upper frenulum, limited movement with tongue.  Suggested she show to her Pediatrician. Mother placed baby in football hold.  Suggested mother bring baby to her. Reviewed breast massage and cluster feeding. Mother nipples are tender and demonstrated how to apply ebm. Mom encouraged to feed baby 8-12 times/24 hours and with feeding cues.  Mom made aware of O/P services, breastfeeding support groups, community resources, and our phone # for post-discharge questions.    Patient Name: [name] [id number] Date: 12/08/2013 Reason for consult: Initial assessment   Maternal Data Has patient been taught Hand Expression?: Yes Does the patient have breastfeeding experience prior to this delivery?: No  Feeding Feeding Type: Breast Fed  LATCH Score/Interventions Latch: Grasps breast easily, tongue down, lips flanged, rhythmical sucking.  Audible Swallowing: A few with stimulation Intervention(s): Alternate breast massage  Type of Nipple: Everted at rest and after stimulation  Comfort (Breast/Nipple): Filling, red/small blisters or bruises, mild/mod discomfort  Problem noted: Mild/Moderate discomfort Interventions (Mild/moderate discomfort): Hand expression  Hold (Positioning): No assistance needed to correctly position infant at breast.  LATCH Score: 8  Lactation Tools Discussed/Used     Consult Status Consult Status: Follow-up Date: 12/09/13 Follow-up type: In-patient    Dahlia Byes [facility] 12/08/2013, 4:46 PM

## 2013-12-08 NOTE — Progress Notes (Signed)
PPD #1 No problems Afeb, VSS Fundus firm, NT at U-1 Continue routine postpartum care 

## 2013-12-09 MED ORDER — OXYCODONE-ACETAMINOPHEN 5-325 MG PO TABS: 1.0000 | ORAL_TABLET | ORAL | Status: DC | PRN

## 2013-12-09 MED ORDER — IBUPROFEN 600 MG PO TABS: 600.0000 mg | ORAL_TABLET | Freq: Four times a day (QID) | ORAL | Status: DC

## 2013-12-09 NOTE — Lactation Note (Addendum)
This note was copied from the chart of Yolanda Berg. Lactation Consultation Note  P1, 10% weight loss. Set up DEBP, reviewed cleaning and milk storage.  Suggest mother post pump 15-20 min and give baby back volume pumped. Mother pumped 15 min. Drops of colostrum pumped.  Encouarged mother to postpump 4-6 times a day.   Baby has thick labial frenum, distinct retraction of upper lip, can flange slightly. Noted posterior lingual frenulum blanched under tongue.  Maternal nipple soreness. Upon entering the room baby just finished breastfeeding for 25 min and fell asleep. While in room baby woke back up and starting cueing.  Baby seems unsatisfied after long feeding. Mother put baby in football position on other breast.  Baby latched easily.  Rhythmical sucks observed but no swallows noted. Assisted in flanging infants upper lip while feeding.  Encouraged mother to massage her breast while feeding. Did pre and post feeding weights.  3450 kg before and after feeding.  No change. [name] MD and requested hydrolyzed formula order due to mother's gluten allergy. Provided volume guidelines.  Mother gave baby 3 ml of pregestimil with bottle and fell asleep.   Baby showing feeding cues later.  Given 10ml more of formula. Provided DEBP rental. Offered mother outpatient appt.  for added support. Mother did not want an appt.      Patient Name: Yolanda Berg [id number] Date: 12/09/2013 Reason for consult: Follow-up assessment   Maternal Data    Feeding Feeding Type: Formula Length of feed: 25 min  LATCH Score/Interventions Latch: Grasps breast easily, tongue down, lips flanged, rhythmical sucking.  Audible Swallowing: A few with stimulation  Type of Nipple: Everted at rest and after stimulation  Comfort (Breast/Nipple): Filling, red/small blisters or bruises, mild/mod discomfort  Problem noted: Mild/Moderate discomfort Interventions (Mild/moderate discomfort): Comfort  gels;Hand expression  Hold (Positioning): Assistance needed to correctly position infant at breast and maintain latch.  LATCH Score: 7  Lactation Tools Discussed/Used     Consult Status Consult Status: Follow-up Date: 12/10/13 Follow-up type: In-patient    Dahlia Byes [facility] 12/09/2013, 11:48 AM

## 2013-12-09 NOTE — Discharge Summary (Signed)
Obstetric Discharge Summary Reason for Admission: induction of labor Prenatal Procedures: none Intrapartum Procedures: spontaneous vaginal delivery Postpartum Procedures: none Complications-Operative and Postpartum: large, irregular 2nd degree perineal laceration Hemoglobin  Date Value Ref Range Status  12/07/2013 12.3  12.0 - 15.0 g/dL Final     HCT  Date Value Ref Range Status  12/07/2013 36.4  36.0 - 46.0 % Final    Physical Exam:  General: alert Lochia: appropriate Uterine Fundus: firm   Discharge Diagnoses: Term Pregnancy-delivered  Discharge Information: Date: 12/09/2013 Activity: pelvic rest Diet: routine Medications: Ibuprofen and Percocet Condition: stable Instructions: refer to practice specific booklet Discharge to: home Follow-up Information   Follow up with Roxene Alviar D, MD On 01/18/2014. (01/18/2014 @ 1415)    Specialty:  Obstetrics and Gynecology   Contact information:   [address], [address] [place] [address] [phone]       Follow up with Lynsay Fesperman D, MD. Schedule an appointment as soon as possible for a visit in 6 weeks.   Specialty:  Obstetrics and Gynecology   Contact information:   [address] [place] [address] [phone]       Newborn Data: Live born female  Birth Weight: 8 lb 8.2 oz (3861 g) APGAR: 9, 9  Home with mother.  Elberta Lachapelle D 12/09/2013, 8:27 AM

## 2013-12-09 NOTE — Discharge Instructions (Signed)
As per discharge pamphlet °

## 2013-12-09 NOTE — Progress Notes (Signed)
PPD #2 No problems Afeb, VSS D/c home 

## 2013-12-11 NOTE — Anesthesia Postprocedure Evaluation (Addendum)
  Anesthesia Post-op Note  Patient stable following vaginal delivery.  

## 2013-12-16 ENCOUNTER — Ambulatory Visit (HOSPITAL_COMMUNITY): Payer: BC Managed Care – PPO

## 2014-02-08 ENCOUNTER — Encounter (HOSPITAL_COMMUNITY): Payer: Self-pay

## 2014-03-02 ENCOUNTER — Encounter (HOSPITAL_COMMUNITY): Payer: Self-pay

## 2014-03-09 ENCOUNTER — Ambulatory Visit (HOSPITAL_COMMUNITY): Payer: BC Managed Care – PPO | Admitting: Anesthesiology

## 2014-03-09 ENCOUNTER — Encounter (HOSPITAL_COMMUNITY): Payer: Self-pay | Admitting: Anesthesiology

## 2014-03-09 ENCOUNTER — Ambulatory Visit (HOSPITAL_COMMUNITY)
Admission: RE | Admit: 2014-03-09 | Discharge: 2014-03-09 | Disposition: A | Discharge status: Home / Self Care | Payer: BC Managed Care – PPO | Source: Ambulatory Visit | Attending: Obstetrics and Gynecology | Admitting: Obstetrics and Gynecology

## 2014-03-09 ENCOUNTER — Encounter (HOSPITAL_COMMUNITY)
Admission: RE | Disposition: A | Discharge status: Home / Self Care | Payer: Self-pay | Source: Ambulatory Visit | Attending: Obstetrics and Gynecology

## 2014-03-09 DIAGNOSIS — E669 Obesity, unspecified: Secondary | ICD-10-CM | POA: Diagnosis not present

## 2014-03-09 DIAGNOSIS — Z6824 Body mass index (BMI) 24.0-24.9, adult: Secondary | ICD-10-CM | POA: Insufficient documentation

## 2014-03-09 DIAGNOSIS — N939 Abnormal uterine and vaginal bleeding, unspecified: Secondary | ICD-10-CM | POA: Diagnosis present

## 2014-03-09 HISTORY — DX: Zoster without complications: B02.9

## 2014-03-09 HISTORY — PX: HYSTEROSCOPY WITH D & C: SHX1775

## 2014-03-09 LAB — CBC
HCT: 40.2 % (ref 36.0–46.0)
HEMOGLOBIN: 13.5 g/dL (ref 12.0–15.0)
MCH: 29.2 pg (ref 26.0–34.0)
MCHC: 33.6 g/dL (ref 30.0–36.0)
MCV: 87 fL (ref 78.0–100.0)
Platelets: 180 10*3/uL (ref 150–400)
RBC: 4.62 MIL/uL (ref 3.87–5.11)
RDW: 12.6 % (ref 11.5–15.5)
WBC: 5 10*3/uL (ref 4.0–10.5)

## 2014-03-09 LAB — ABO/RH: ABO/RH(D): O POS

## 2014-03-09 LAB — HCG, QUANTITATIVE, PREGNANCY

## 2014-03-09 SURGERY — DILATATION AND CURETTAGE /HYSTEROSCOPY
Anesthesia: General | Site: Uterus

## 2014-03-09 MED ORDER — LACTATED RINGERS IV SOLN
INTRAVENOUS | Status: DC
  Administered 2014-03-09 (×2): via INTRAVENOUS

## 2014-03-09 MED ORDER — LIDOCAINE HCL (CARDIAC) 20 MG/ML IV SOLN
INTRAVENOUS | Status: AC
Start: 2014-03-09 — End: 2014-03-09
  Filled 2014-03-09: qty 5

## 2014-03-09 MED ORDER — ONDANSETRON HCL 4 MG/2ML IJ SOLN
INTRAMUSCULAR | Status: DC | PRN
  Administered 2014-03-09: 4 mg via INTRAVENOUS

## 2014-03-09 MED ORDER — KETOROLAC TROMETHAMINE 30 MG/ML IJ SOLN
INTRAMUSCULAR | Status: DC | PRN
  Administered 2014-03-09: 30 mg via INTRAVENOUS

## 2014-03-09 MED ORDER — LIDOCAINE HCL 2 % IJ SOLN
INTRAMUSCULAR | Status: DC | PRN
  Administered 2014-03-09: 12 mL

## 2014-03-09 MED ORDER — KETOROLAC TROMETHAMINE 30 MG/ML IJ SOLN
INTRAMUSCULAR | Status: AC
  Filled 2014-03-09: qty 1

## 2014-03-09 MED ORDER — SODIUM CHLORIDE 0.9 % IR SOLN
Status: DC | PRN
  Administered 2014-03-09: 3000 mL

## 2014-03-09 MED ORDER — ONDANSETRON HCL 4 MG/2ML IJ SOLN
INTRAMUSCULAR | Status: AC
  Filled 2014-03-09: qty 2

## 2014-03-09 MED ORDER — FENTANYL CITRATE 0.05 MG/ML IJ SOLN
INTRAMUSCULAR | Status: AC
  Filled 2014-03-09: qty 2

## 2014-03-09 MED ORDER — LIDOCAINE HCL 2 % IJ SOLN
INTRAMUSCULAR | Status: AC
  Filled 2014-03-09: qty 20

## 2014-03-09 MED ORDER — SCOPOLAMINE 1 MG/3DAYS TD PT72
1.0000 | MEDICATED_PATCH | Freq: Once | TRANSDERMAL | Status: AC
  Administered 2014-03-09: 1 via TRANSDERMAL
  Administered 2014-03-09: 1.5 mg via TRANSDERMAL

## 2014-03-09 MED ORDER — MEPERIDINE HCL 25 MG/ML IJ SOLN: 6.2500 mg | INTRAMUSCULAR | Status: DC | PRN

## 2014-03-09 MED ORDER — MIDAZOLAM HCL 2 MG/2ML IJ SOLN
INTRAMUSCULAR | Status: AC
  Filled 2014-03-09: qty 2

## 2014-03-09 MED ORDER — GLYCOPYRROLATE 0.2 MG/ML IJ SOLN
INTRAMUSCULAR | Status: AC
  Filled 2014-03-09: qty 1

## 2014-03-09 MED ORDER — FENTANYL CITRATE 0.05 MG/ML IJ SOLN: 25.0000 ug | INTRAMUSCULAR | Status: DC | PRN

## 2014-03-09 MED ORDER — FENTANYL CITRATE 0.05 MG/ML IJ SOLN
INTRAMUSCULAR | Status: DC | PRN
  Administered 2014-03-09: 25 ug via INTRAVENOUS
  Administered 2014-03-09: 50 ug via INTRAVENOUS
  Administered 2014-03-09: 25 ug via INTRAVENOUS

## 2014-03-09 MED ORDER — DEXAMETHASONE SODIUM PHOSPHATE 10 MG/ML IJ SOLN
INTRAMUSCULAR | Status: AC
  Filled 2014-03-09: qty 1

## 2014-03-09 MED ORDER — MIDAZOLAM HCL 2 MG/2ML IJ SOLN
INTRAMUSCULAR | Status: DC | PRN
  Administered 2014-03-09: 2 mg via INTRAVENOUS

## 2014-03-09 MED ORDER — GLYCOPYRROLATE 0.2 MG/ML IJ SOLN
INTRAMUSCULAR | Status: DC | PRN
  Administered 2014-03-09: 0.2 mg via INTRAVENOUS

## 2014-03-09 MED ORDER — PROPOFOL 10 MG/ML IV BOLUS
INTRAVENOUS | Status: DC | PRN
  Administered 2014-03-09: 200 mg via INTRAVENOUS

## 2014-03-09 MED ORDER — LIDOCAINE HCL (CARDIAC) 20 MG/ML IV SOLN
INTRAVENOUS | Status: DC | PRN
  Administered 2014-03-09: 60 mg via INTRAVENOUS

## 2014-03-09 MED ORDER — METOCLOPRAMIDE HCL 5 MG/ML IJ SOLN: 10.0000 mg | Freq: Once | INTRAMUSCULAR | Status: DC | PRN

## 2014-03-09 MED ORDER — DEXAMETHASONE SODIUM PHOSPHATE 10 MG/ML IJ SOLN
INTRAMUSCULAR | Status: DC | PRN
  Administered 2014-03-09: 5 mg via INTRAVENOUS

## 2014-03-09 SURGICAL SUPPLY — 20 items
ABLATOR ENDOMETRIAL BIPOLAR (ABLATOR) IMPLANT
CANISTER SUCT 3000ML (MISCELLANEOUS) ×2 IMPLANT
CATH ROBINSON RED A/P 16FR (CATHETERS) ×2 IMPLANT
CLOTH BEACON ORANGE TIMEOUT ST (SAFETY) ×2 IMPLANT
CONTAINER PREFILL 10% NBF 60ML (FORM) ×4 IMPLANT
ELECT REM PT RETURN 9FT ADLT (ELECTROSURGICAL)
ELECTRODE REM PT RTRN 9FT ADLT (ELECTROSURGICAL) IMPLANT
GLOVE BIO SURGEON STRL SZ8 (GLOVE) ×2 IMPLANT
GLOVE BIOGEL PI IND STRL 7.0 (GLOVE) ×2 IMPLANT
GLOVE BIOGEL PI INDICATOR 7.0 (GLOVE) ×2
GLOVE ECLIPSE 7.0 STRL STRAW (GLOVE) ×2 IMPLANT
GLOVE ORTHO TXT STRL SZ7.5 (GLOVE) ×2 IMPLANT
GOWN STRL REUS W/TWL LRG LVL3 (GOWN DISPOSABLE) ×4 IMPLANT
LOOP ANGLED CUTTING 22FR (CUTTING LOOP) IMPLANT
PACK VAGINAL MINOR WOMEN LF (CUSTOM PROCEDURE TRAY) ×2 IMPLANT
PAD OB MATERNITY 4.3X12.25 (PERSONAL CARE ITEMS) ×2 IMPLANT
TOWEL OR 17X24 6PK STRL BLUE (TOWEL DISPOSABLE) ×4 IMPLANT
TUBING AQUILEX INFLOW (TUBING) ×2 IMPLANT
TUBING AQUILEX OUTFLOW (TUBING) ×2 IMPLANT
WATER STERILE IRR 1000ML POUR (IV SOLUTION) ×2 IMPLANT

## 2014-03-09 NOTE — Op Note (Signed)
Preoperative diagnosis: Abnormal uterine bleeding, possible retained POC Postoperative diagnosis: Abnormal uterine bleeding Procedure: Hysteroscopy with dilation and curettage Surgeon: Lavina Hammanodd Prentice Sackrider M.D. Anesthesia: Gen. With an LMA, paracervical block Findings: She had a normal endometrial cavity with a small amount of endometrial tissue. No significant endometrial lesions, no fibroids or polyps. Estimated blood loss: Minimal Fluid deficit: Through the hysteroscope fluid deficit was about 216 cc Specimens: Endometrial curettings sent for routine pathology Complications: None  Procedure in detail: The patient was taken to the operating room and placed in the dorsosupine position. General anesthesia was induced. She was placed in mobile stirrups and legs were elevated. Perineum and vagina were prepped and draped in the usual sterile fashion and bladder drained with a red Robinson catheter. A Graves speculum was inserted in the vagina and the anterior lip of the cervix was grasped with a single-tooth tenaculum. Paracervical block was performed with a total of 12 cc of 2% plain lidocaine. The uterus then sounded to 8 cm. The cervix was gradually dilated to a size 19 dilator without difficulty. The observer hysteroscope was inserted and good visualization was achieved. No significant endometrial lesions were identified. The hysteroscope was removed. Sharp curettage was performed which revealed minimal endometrial tissue. Hysteroscopy was again performed and revealed a small amount of tissue in the anterior fundus. The hysteroscope was again removed and curettage was performed return remove this tissue. The single-tooth tenaculum was removed from the cervix and bleeding was controlled with pressure. All instruments were then removed from the vagina. The patient tolerated the procedure well and was taken to the recovery in stable condition. Counts were correct and she had PAS hose on throughout the  procedure.

## 2014-03-09 NOTE — Anesthesia Preprocedure Evaluation (Addendum)
Anesthesia Evaluation  Patient identified by MRN, date of birth, ID band Patient awake    Reviewed: Allergy & Precautions, H&P , NPO status , Patient's Chart, lab work & pertinent test results  Airway Mallampati: II  TM Distance: >3 FB Neck ROM: Full    Dental no notable dental hx. (+) Teeth Intact   Pulmonary neg pulmonary ROS,  breath sounds clear to auscultation  Pulmonary exam normal       Cardiovascular Rhythm:Regular Rate:Normal     Neuro/Psych negative neurological ROS  negative psych ROS   GI/Hepatic Neg liver ROS, Celiac disease   Endo/Other  Obesity  Renal/GU negative Renal ROS  negative genitourinary   Musculoskeletal negative musculoskeletal ROS (+)   Abdominal (+) + obese,   Peds  Hematology negative hematology ROS (+)   Anesthesia Other Findings   Reproductive/Obstetrics AUB Possible retained POC                             Anesthesia Physical Anesthesia Plan  ASA: II  Anesthesia Plan: General   Post-op Pain Management:    Induction: Intravenous  Airway Management Planned: LMA  Additional Equipment:   Intra-op Plan:   Post-operative Plan: Extubation in OR  Informed Consent: I have reviewed the patients History and Physical, chart, labs and discussed the procedure including the risks, benefits and alternatives for the proposed anesthesia with the patient or authorized representative who has indicated his/her understanding and acceptance.   Dental advisory given  Plan Discussed with: CRNA, Anesthesiologist and Surgeon  Anesthesia Plan Comments:         Anesthesia Quick Evaluation

## 2014-03-09 NOTE — H&P (Signed)
Yolanda Berg is an 28 y.o. female. She is s/p SVD 8-31 without complications, normal postpartum exam in mid-October.  However, since postpartum exam, she is having random episodes of heavy bleeding and cramping.  Pelvic ultrasound on 11-19 reveals a normal size uterus with fluid in the endometrial cavity outlining irregular tissue at the uterine fundus.  Pertinent Gynecological History: Last pap: normal OB History: G1, P1011, SVD without comps   Menstrual History: No LMP recorded.    Past Medical History  Diagnosis Date  . Celiac disease   . Staph infection     pt denies  . Shingles     Past Surgical History  Procedure Laterality Date  . Wisdom tooth extraction    . Pilonidal cyst / sinus excision    . Toe surgery    . Upper gi endoscopy      History reviewed. No pertinent family history.  Social History:  reports that she has never smoked. She has never used smokeless tobacco. She reports that she does not drink alcohol or use illicit drugs.  Allergies: No Known Allergies  No prescriptions prior to admission    Review of Systems  Respiratory: Negative.   Cardiovascular: Negative.   Gastrointestinal: Negative.   Genitourinary: Negative.     unknown if currently breastfeeding. Physical Exam  Constitutional: She appears well-developed and well-nourished.  Cardiovascular: Normal rate, regular rhythm and normal heart sounds.   No murmur heard. Respiratory: Effort normal and breath sounds normal. No respiratory distress. She has no wheezes.  GI: Soft. She exhibits no distension and no mass. There is no tenderness.  Genitourinary: Vagina normal.  Uterus normal size, nontender No adnexal mass    No results found for this or any previous visit (from the past 24 hour(s)).  No results found.  Assessment/Plan: AUB with possible retained POC by ultrasound.  Will admit for hysteroscopy D&C.  Silver Parkey D 03/09/2014, 9:14 AM

## 2014-03-09 NOTE — Anesthesia Postprocedure Evaluation (Signed)
  Anesthesia Post-op Note  Patient: Yolanda KehrFelicia Berg  Procedure(s) Performed: Procedure(s): DILATATION AND CURETTAGE /HYSTEROSCOPY (N/A)  Patient Location: PACU  Anesthesia Type:General  Level of Consciousness: awake, alert  and oriented  Airway and Oxygen Therapy: Patient Spontanous Breathing  Post-op Pain: none  Post-op Assessment: Post-op Vital signs reviewed, Patient's Cardiovascular Status Stable, Respiratory Function Stable, Patent Airway, No signs of Nausea or vomiting and Pain level controlled  Post-op Vital Signs: Reviewed and stable  Last Vitals:  Filed Vitals:   03/09/14 1200  BP: 106/59  Pulse: 64  Temp:   Resp: 16    Complications: No apparent anesthesia complications

## 2014-03-09 NOTE — Interval H&P Note (Signed)
History and Physical Interval Note:  03/09/2014 10:41 AM  Yolanda KehrFelicia Berg  has presented today for surgery, with the diagnosis of AUB,possible retained POC, [id number]  The various methods of treatment have been discussed with the patient and family. After consideration of risks, benefits and other options for treatment, the patient has consented to  Procedure(s): DILATATION AND CURETTAGE /HYSTEROSCOPY (N/A) as a surgical intervention .  The patient's history has been reviewed, patient examined, no change in status, stable for surgery.  I have reviewed the patient's chart and labs.  Questions were answered to the patient's satisfaction.     Jia Mohamed D

## 2014-03-09 NOTE — Transfer of Care (Signed)
Immediate Anesthesia Transfer of Care Note  Patient: Yolanda Berg  Procedure(s) Performed: Procedure(s): DILATATION AND CURETTAGE /HYSTEROSCOPY (N/A)  Patient Location: PACU  Anesthesia Type:General  Level of Consciousness: awake, alert  and oriented  Airway & Oxygen Therapy: Patient Spontanous Breathing and Patient connected to nasal cannula oxygen  Post-op Assessment: Report given to PACU RN, Post -op Vital signs reviewed and stable and Patient moving all extremities X 4  Post vital signs: Reviewed and stable  Complications: No apparent anesthesia complications

## 2014-03-09 NOTE — Discharge Instructions (Signed)
Routine instructions for hysteroscopy, D&C Post Anesthesia Home Care Instructions  Activity: Get plenty of rest for the remainder of the day. A responsible adult should stay with you for 24 hours following the procedure.  For the next 24 hours, DO NOT: -Drive a car -Advertising copywriterperate machinery -Drink alcoholic beverages -Take any medication unless instructed by your physician -Make any legal decisions or sign important papers.  Meals: Start with liquid foods such as gelatin or soup. Progress to regular foods as tolerated. Avoid greasy, spicy, heavy foods. If nausea and/or vomiting occur, drink only clear liquids until the nausea and/or vomiting subsides. Call your physician if vomiting continues.  Special Instructions/Symptoms: Your throat may feel dry or sore from the anesthesia or the breathing tube placed in your throat during surgery. If this causes discomfort, gargle with warm salt water. The discomfort should disappear within 24 hours.

## 2014-03-10 ENCOUNTER — Encounter (HOSPITAL_COMMUNITY): Payer: Self-pay | Admitting: Obstetrics and Gynecology

## 2014-03-10 LAB — TYPE AND SCREEN
ABO/RH(D): O POS
ANTIBODY SCREEN: NEGATIVE

## 2014-05-25 ENCOUNTER — Other Ambulatory Visit: Payer: Self-pay | Admitting: Physician Assistant

## 2014-05-25 ENCOUNTER — Other Ambulatory Visit (HOSPITAL_COMMUNITY)
Admission: RE | Admit: 2014-05-25 | Discharge: 2014-05-25 | Disposition: A | Discharge status: Home / Self Care | Payer: BLUE CROSS/BLUE SHIELD | Source: Ambulatory Visit | Attending: Family Medicine | Admitting: Family Medicine

## 2014-05-25 DIAGNOSIS — Z01419 Encounter for gynecological examination (general) (routine) without abnormal findings: Secondary | ICD-10-CM | POA: Insufficient documentation

## 2014-05-26 LAB — CYTOLOGY - PAP

## 2015-05-21 ENCOUNTER — Inpatient Hospital Stay (HOSPITAL_COMMUNITY): Payer: BLUE CROSS/BLUE SHIELD

## 2015-05-21 ENCOUNTER — Inpatient Hospital Stay (HOSPITAL_COMMUNITY)
Admission: AD | Admit: 2015-05-21 | Discharge: 2015-07-05 | DRG: 765 | Disposition: A | Discharge status: Home / Self Care | Payer: BLUE CROSS/BLUE SHIELD | Source: Ambulatory Visit | Attending: Obstetrics and Gynecology | Admitting: Obstetrics and Gynecology

## 2015-05-21 ENCOUNTER — Encounter (HOSPITAL_COMMUNITY): Payer: Self-pay

## 2015-05-21 DIAGNOSIS — O42112 Preterm premature rupture of membranes, onset of labor more than 24 hours following rupture, second trimester: Principal | ICD-10-CM | POA: Diagnosis present

## 2015-05-21 DIAGNOSIS — O42913 Preterm premature rupture of membranes, unspecified as to length of time between rupture and onset of labor, third trimester: Secondary | ICD-10-CM | POA: Diagnosis not present

## 2015-05-21 DIAGNOSIS — L22 Diaper dermatitis: Secondary | ICD-10-CM | POA: Diagnosis not present

## 2015-05-21 DIAGNOSIS — Z23 Encounter for immunization: Secondary | ICD-10-CM | POA: Diagnosis not present

## 2015-05-21 DIAGNOSIS — Z98891 History of uterine scar from previous surgery: Secondary | ICD-10-CM

## 2015-05-21 DIAGNOSIS — O320XX0 Maternal care for unstable lie, not applicable or unspecified: Secondary | ICD-10-CM

## 2015-05-21 DIAGNOSIS — Z3A27 27 weeks gestation of pregnancy: Secondary | ICD-10-CM

## 2015-05-21 DIAGNOSIS — O321XX0 Maternal care for breech presentation, not applicable or unspecified: Secondary | ICD-10-CM | POA: Diagnosis present

## 2015-05-21 DIAGNOSIS — O42912 Preterm premature rupture of membranes, unspecified as to length of time between rupture and onset of labor, second trimester: Secondary | ICD-10-CM | POA: Diagnosis not present

## 2015-05-21 DIAGNOSIS — O42119 Preterm premature rupture of membranes, onset of labor more than 24 hours following rupture, unspecified trimester: Secondary | ICD-10-CM | POA: Diagnosis present

## 2015-05-21 DIAGNOSIS — Z3A33 33 weeks gestation of pregnancy: Secondary | ICD-10-CM

## 2015-05-21 DIAGNOSIS — K900 Celiac disease: Secondary | ICD-10-CM | POA: Diagnosis present

## 2015-05-21 DIAGNOSIS — O9962 Diseases of the digestive system complicating childbirth: Secondary | ICD-10-CM | POA: Diagnosis present

## 2015-05-21 DIAGNOSIS — Z0489 Encounter for examination and observation for other specified reasons: Secondary | ICD-10-CM

## 2015-05-21 DIAGNOSIS — IMO0002 Reserved for concepts with insufficient information to code with codable children: Secondary | ICD-10-CM

## 2015-05-21 DIAGNOSIS — O4290 Premature rupture of membranes, unspecified as to length of time between rupture and onset of labor, unspecified weeks of gestation: Secondary | ICD-10-CM

## 2015-05-21 DIAGNOSIS — Z3A29 29 weeks gestation of pregnancy: Secondary | ICD-10-CM

## 2015-05-21 DIAGNOSIS — O42013 Preterm premature rupture of membranes, onset of labor within 24 hours of rupture, third trimester: Secondary | ICD-10-CM

## 2015-05-21 DIAGNOSIS — Z3A32 32 weeks gestation of pregnancy: Secondary | ICD-10-CM

## 2015-05-21 HISTORY — DX: History of uterine scar from previous surgery: Z98.891

## 2015-05-21 LAB — CBC WITH DIFFERENTIAL/PLATELET
Basophils Absolute: 0.1 10*3/uL (ref 0.0–0.1)
Basophils Relative: 1 %
EOS PCT: 1 %
Eosinophils Absolute: 0 10*3/uL (ref 0.0–0.7)
HEMATOCRIT: 36.6 % (ref 36.0–46.0)
Hemoglobin: 12.4 g/dL (ref 12.0–15.0)
LYMPHS ABS: 1.8 10*3/uL (ref 0.7–4.0)
LYMPHS PCT: 21 %
MCH: 29.4 pg (ref 26.0–34.0)
MCHC: 33.9 g/dL (ref 30.0–36.0)
MCV: 86.7 fL (ref 78.0–100.0)
MONO ABS: 0.4 10*3/uL (ref 0.1–1.0)
MONOS PCT: 4 %
NEUTROS ABS: 6.5 10*3/uL (ref 1.7–7.7)
Neutrophils Relative %: 73 %
PLATELETS: 163 10*3/uL (ref 150–400)
RBC: 4.22 MIL/uL (ref 3.87–5.11)
RDW: 13.3 % (ref 11.5–15.5)
WBC: 8.8 10*3/uL (ref 4.0–10.5)

## 2015-05-21 LAB — WET PREP, GENITAL
Clue Cells Wet Prep HPF POC: NONE SEEN
SPERM: NONE SEEN
Trich, Wet Prep: NONE SEEN
Yeast Wet Prep HPF POC: NONE SEEN

## 2015-05-21 LAB — URINALYSIS, ROUTINE W REFLEX MICROSCOPIC
BILIRUBIN URINE: NEGATIVE
Glucose, UA: NEGATIVE mg/dL
Hgb urine dipstick: NEGATIVE
Ketones, ur: NEGATIVE mg/dL
Leukocytes, UA: NEGATIVE
Nitrite: NEGATIVE
PROTEIN: NEGATIVE mg/dL
Specific Gravity, Urine: 1.01 (ref 1.005–1.030)
pH: 6 (ref 5.0–8.0)

## 2015-05-21 LAB — POCT FERN TEST: POCT Fern Test: POSITIVE

## 2015-05-21 LAB — TYPE AND SCREEN
ABO/RH(D): O POS
Antibody Screen: NEGATIVE

## 2015-05-21 MED ORDER — PRENATAL MULTIVITAMIN CH
1.0000 | ORAL_TABLET | Freq: Every day | ORAL | Status: DC
  Administered 2015-05-21 – 2015-07-03 (×44): 1 via ORAL
  Filled 2015-05-21 (×44): qty 1

## 2015-05-21 MED ORDER — BETAMETHASONE SOD PHOS & ACET 6 (3-3) MG/ML IJ SUSP
12.0000 mg | Freq: Once | INTRAMUSCULAR | Status: AC
  Administered 2015-05-21: 12 mg via INTRAMUSCULAR
  Filled 2015-05-21: qty 2

## 2015-05-21 MED ORDER — AZITHROMYCIN 250 MG PO TABS
500.0000 mg | ORAL_TABLET | Freq: Every day | ORAL | Status: AC
  Administered 2015-05-22 – 2015-05-27 (×6): 500 mg via ORAL
  Filled 2015-05-21 (×6): qty 2

## 2015-05-21 MED ORDER — ACETAMINOPHEN 325 MG PO TABS
650.0000 mg | ORAL_TABLET | ORAL | Status: DC | PRN
  Administered 2015-05-21 – 2015-07-03 (×17): 650 mg via ORAL
  Filled 2015-05-21 (×17): qty 2

## 2015-05-21 MED ORDER — CALCIUM CARBONATE ANTACID 500 MG PO CHEW
2.0000 | CHEWABLE_TABLET | ORAL | Status: DC | PRN
  Administered 2015-06-14: 400 mg via ORAL
  Filled 2015-05-21: qty 2

## 2015-05-21 MED ORDER — AZITHROMYCIN 250 MG PO TABS: 500.0000 mg | ORAL_TABLET | Freq: Every day | ORAL | Status: DC

## 2015-05-21 MED ORDER — SODIUM CHLORIDE 0.9 % IV SOLN
2.0000 g | Freq: Four times a day (QID) | INTRAVENOUS | Status: DC
  Administered 2015-05-21: 2 g via INTRAVENOUS
  Filled 2015-05-21 (×2): qty 2000

## 2015-05-21 MED ORDER — SODIUM CHLORIDE 0.9 % IV SOLN
2.0000 g | Freq: Four times a day (QID) | INTRAVENOUS | Status: AC
  Administered 2015-05-21 – 2015-05-23 (×7): 2 g via INTRAVENOUS
  Filled 2015-05-21 (×9): qty 2000

## 2015-05-21 MED ORDER — DOCUSATE SODIUM 100 MG PO CAPS
100.0000 mg | ORAL_CAPSULE | Freq: Every day | ORAL | Status: DC
  Administered 2015-05-22 – 2015-07-02 (×11): 100 mg via ORAL
  Filled 2015-05-21 (×21): qty 1

## 2015-05-21 MED ORDER — LACTATED RINGERS IV SOLN
INTRAVENOUS | Status: DC
  Administered 2015-05-21 – 2015-05-22 (×3): via INTRAVENOUS

## 2015-05-21 MED ORDER — AMOXICILLIN 500 MG PO CAPS
500.0000 mg | ORAL_CAPSULE | Freq: Three times a day (TID) | ORAL | Status: AC
  Administered 2015-05-23 – 2015-05-27 (×15): 500 mg via ORAL
  Filled 2015-05-21 (×15): qty 1

## 2015-05-21 MED ORDER — LACTATED RINGERS IV SOLN
INTRAVENOUS | Status: DC
  Administered 2015-05-21: 09:00:00 via INTRAVENOUS

## 2015-05-21 MED ORDER — AZITHROMYCIN 250 MG PO TABS
1000.0000 mg | ORAL_TABLET | Freq: Once | ORAL | Status: AC
  Administered 2015-05-21: 1000 mg via ORAL
  Filled 2015-05-21: qty 4

## 2015-05-21 MED ORDER — DEXTROSE 5 % IV SOLN: 500.0000 mg | INTRAVENOUS | Status: DC

## 2015-05-21 MED ORDER — BETAMETHASONE SOD PHOS & ACET 6 (3-3) MG/ML IJ SUSP
12.0000 mg | INTRAMUSCULAR | Status: AC
  Administered 2015-05-22: 12 mg via INTRAMUSCULAR
  Filled 2015-05-21: qty 2

## 2015-05-21 MED ORDER — ZOLPIDEM TARTRATE 5 MG PO TABS
5.0000 mg | ORAL_TABLET | Freq: Every evening | ORAL | Status: DC | PRN
  Administered 2015-05-28 – 2015-06-30 (×7): 5 mg via ORAL
  Filled 2015-05-21 (×9): qty 1

## 2015-05-21 NOTE — MAU Note (Signed)
Leaking clear fluid at 0530, went through underwear onto bed and then again at 0550 and felt damp since then.  No bleeding.  Baby moving well.  Slight tightning on way here and gone now.

## 2015-05-21 NOTE — MAU Note (Signed)
Pt making decision about possible transfer with family.

## 2015-05-21 NOTE — Consult Note (Signed)
Asked by Dr.Richardson to provide prenatal consultation for patient at risk for preterm delivery due to PROM.  Mother is 30 y.o. G2 P1 who is now 27.[redacted] weeks EGA, with pregnancy uncomplicated until she had loss of fluid today. She is being treated with betamethasone (first dose 8am today) and ampicillin. No contractions and no Sx of infection.Marland Kitchen  Spoke with patient along with her husband and his parents.  Discussed usual expectations for preterm infant at 41 - [redacted] weeks gestation, including possible needs for DR resuscitation, respiratory support, IV access, and blood products.  Also presented risks of death or serious morbidity.  Projected possible length of stay in NICU until 36 - [redacted] wks EGA.  Discussed advantages of feeding with mother's milk.  She plans to pump postnatally and we discussed possible use of donor milk as "bridge." Also discussed family involvement in care and planning.  Patient was attentive, had appropriate questions, and was appreciative of my input.  Thank you for the consultation.  Total time 25 minutes

## 2015-05-21 NOTE — MAU Provider Note (Signed)
History    CSN: [id number] Arrival date and time: 05/21/15 4098 First Provider Initiated Contact with Patient 05/21/15 [phone]     Chief Complaint  Patient presents with  . Vaginal Discharge    leaking fluid   HPI Patient is 30 y.o. G2P1001 [redacted]w[redacted]d here with complaints of leaking fluid.   0530 woke up in a pool of fluid. She reports soaking through her underwear and onto her sheets. She reports continued leaking especially with changes in position. Pregnancy has been uncomplicated thus far. No history of PTB. Denies fever, chills, abdominal pain, pelvic pain.   +FM, VB, contractions, vaginal discharge.   OB History    Gravida Para Term Preterm AB TAB SAB Ectopic Multiple Living   Past Medical History  Diagnosis Date  . Celiac disease   . Staph infection     pt denies  . Shingles     Past Surgical History  Procedure Laterality Date  . Wisdom tooth extraction    . Pilonidal cyst / sinus excision    . Toe surgery    . Upper gi endoscopy    . Hysteroscopy w/d&c N/A 03/09/2014    Procedure: DILATATION AND CURETTAGE /HYSTEROSCOPY;  Surgeon: Lavina Hamman, MD;  Location: [facility];  Service: Gynecology;  Laterality: N/A;    History reviewed. No pertinent family history.  Social History  Substance Use Topics  . Smoking status: Never Smoker   . Smokeless tobacco: Never Used  . Alcohol Use: No    Allergies: No Known Allergies  Prescriptions prior to admission  Medication Sig Dispense Refill Last Dose  . calcium carbonate (TUMS - DOSED IN MG ELEMENTAL CALCIUM) 500 MG chewable tablet Chew 1 tablet by mouth as needed for indigestion or heartburn.   05/20/2015 at Unknown time  . Prenatal Vit-Fe Fumarate-FA (PRENATAL MULTIVITAMIN) TABS tablet Take 1 tablet by mouth daily at 12 noon.   05/20/2015 at Unknown time  . ibuprofen (ADVIL,MOTRIN) 600 MG tablet Take 1 tablet (600 mg total) by mouth every 6 (six) hours. (Patient not taking: Reported on 05/21/2015) 30 tablet 0    . Multiple Vitamin (MULTIVITAMIN WITH MINERALS) TABS tablet Take 1 tablet by mouth daily.     Marland Kitchen oxyCODONE-acetaminophen (PERCOCET/ROXICET) 5-325 MG per tablet Take 1-2 tablets by mouth every 4 (four) hours as needed for severe pain (moderate - severe pain). (Patient not taking: Reported on 05/21/2015) 20 tablet 0     Review of Systems  Constitutional: Negative for fever and chills.  Eyes: Negative for blurred vision and double vision.  Respiratory: Negative for cough and shortness of breath.   Cardiovascular: Negative for chest pain and orthopnea.  Gastrointestinal: Negative for nausea and vomiting.  Genitourinary: Negative for dysuria, frequency and flank pain.  Musculoskeletal: Negative for myalgias.  Skin: Negative for rash.  Neurological: Negative for dizziness, tingling, weakness and headaches.  Endo/Heme/Allergies: Does not bruise/bleed easily.  Psychiatric/Behavioral: Negative for depression and suicidal ideas. The patient is not nervous/anxious.    Physical Exam   Blood pressure 112/63, pulse 81, temperature 97.7 F (36.5 C), temperature source Oral, resp. rate 16, SpO2 98 %, not currently breastfeeding.  Physical Exam  Nursing note and vitals reviewed. Constitutional: She is oriented to person, place, and time. She appears well-developed and well-nourished. No distress.  Pregnant female  HENT:  Head: Normocephalic and atraumatic.  Eyes: Conjunctivae are normal. No scleral icterus.  Neck: Normal range of motion. Neck  supple.  Cardiovascular: Normal rate and intact distal pulses.   Respiratory: Effort normal. She exhibits no tenderness.  GI: Soft. There is no tenderness. There is no rebound and no guarding.  Gravid  Genitourinary: Vagina normal.  Minimal pooling but increased fluid with coughing.  Visually closed.   Musculoskeletal: Normal range of motion. She exhibits no edema.  Neurological: She is alert and oriented to person, place, and time.  Skin: Skin is warm and  dry. No rash noted.  Psychiatric: She has a normal mood and affect.    MAU Course  Procedures  MDM [name]- POSITIVE GC/CT- collected Wet Prep- collected Group B strep culture- collected  Medications--> Betamethasone, ampicillin, azithromycin  8:09 AM talked Dr. Senaida Ores who spoke with neonatology. They are comfortable with admission given patient is currently closed. Dr. Senaida Ores will come and talk to the patient.    NST: 155/mod/+accels, no decels Toco: quiet  Assessment and Plan  Yolanda Berg is a 30 y.o. G2P1001 at [redacted]w[redacted]d presenting with ROM -Admit to hospital for latency abx, bedrest until 34 weeks or s/sx of labor -plan for formal nicu consult -FWB- reactive  Federico Flake 05/21/2015, 8:09 AM

## 2015-05-21 NOTE — MAU Note (Signed)
Dr. Senaida Ores in to discuss transfer options with pt.

## 2015-05-21 NOTE — H&P (Signed)
Yolanda Berg is a 30 y.o. female G2P1001 at 61 weeks(EDD 08/20/15 by LMP c/w 9 week [place])  presenting for SROM early this AM.  Awoke in bed with sheets and underwear saturated and has had a few more gushes intermittently.  Denies contractions.  Prenatal care uncomplicated otherwise.  Maternal Medical History:  Reason for admission: Rupture of membranes.   Contractions: Onset was 3-5 hours ago.   Frequency: rare.   Perceived severity is mild.    Fetal activity: Perceived fetal activity is normal.    Prenatal Complications - Diabetes: none.    OB History    Gravida Para Term Preterm AB TAB SAB Ectopic Multiple Living   NSVD 8/15 8lb 8oz  Past Medical History  Diagnosis Date  . Celiac disease   . Staph infection     pt denies  . Shingles    Past Surgical History  Procedure Laterality Date  . Wisdom tooth extraction    . Pilonidal cyst / sinus excision    . Toe surgery    . Upper gi endoscopy    . Hysteroscopy w/d&c N/A 03/09/2014    Procedure: DILATATION AND CURETTAGE /HYSTEROSCOPY;  Surgeon: Lavina Hamman, MD;  Location: [facility];  Service: Gynecology;  Laterality: N/A;   Family History: family history is not on file. Social History:  reports that she has never smoked. She has never used smokeless tobacco. She reports that she does not drink alcohol or use illicit drugs.   Prenatal Transfer Tool  Maternal Diabetes: No Genetic Screening: Declined Maternal Ultrasounds/Referrals: Normal Fetal Ultrasounds or other Referrals:  None Maternal Substance Abuse:  No Significant Maternal Medications:  None Significant Maternal Lab Results:  None Other Comments:  None  Review of Systems  Gastrointestinal: Negative for abdominal pain.      Blood pressure 112/63, pulse 81, temperature 97.7 F (36.5 C), temperature source Oral, resp. rate 16, SpO2 100 %, not currently breastfeeding. Maternal Exam:  Uterine Assessment: Contraction strength is mild.  Contraction  frequency is rare.   Abdomen: Patient reports no abdominal tenderness. Fetal presentation: breech  Introitus: Normal vulva. Normal vagina.  Ferning test: positive.  Nitrazine test: positive. Amniotic fluid character: clear.  Cervix: Cervix evaluated by sterile speculum exam.     Physical Exam  Constitutional: She appears well-developed and well-nourished.  Cardiovascular: Normal rate.   Respiratory: Effort normal.  GI: Soft.  Genitourinary: Vagina normal.  Neurological: She is alert.  Psychiatric: She has a normal mood and affect.  Cervix closed on SSE, +pool +fern   [place] normal growth and anatomy EFW 53%ile 2#7oz breech cervix 3.9cm  Prenatal labs: ABO, Rh: --/--/O POS (02/11 0750) Antibody: NEG (02/11 0750) Rubella:  Immune RPR:   NR HBsAg:   Neg HIV:   Neg GBS:   pending One hour GCT 96  Assessment/Plan: Pt with PROM at 27 weeks and no signs of labor onset.  D/w pt, husband, and mother our intent is to prolong latent phase as long as possible with inpatient stay. Received betamethasone 8am and will repeat in 24 hours. Antibiotics begun (ampicillin)   US shows baby frank breech with head in maternal left--d/w pt that delivery would be by c-section unless baby rotates to vertex before labors ensues NICU currently full.  D/w pt we can admit her since she is not laboring and will hopefully have a protracted latent phase, but always the chance if had to be delivered quickly,  the baby may have to be transferred to another facility after delivery.  D/w Dr. Algernon Huxley and he is agreeable to admission with that understanding.  [name] 05/21/2015, 10:05 AM

## 2015-05-22 NOTE — Progress Notes (Signed)
Patient ID: Yolanda Berg, female   DOB: [birth year], 30 y.o.   MRN: [id number] HD #2 PPROM at 27 1/7 weeks/breech  Pt had a good day.  +LOF.  No significant contractions  FHR Category 1 Abdomen NT  Received betamethasone #2 this AM On ampicillin and azithro for antibiotic prophylaxis GBS, GC, and Chlam pending NICU consult obtained  Pt aware if required delivery in immediate future would require transfer to another facility of her while still pregnant if medically stable, Or of baby after delivery due to NICU being full.   Will change from continuous monitoring to NST q shift Saline lock IV Allow to shower.

## 2015-05-23 LAB — CULTURE, BETA STREP (GROUP B ONLY)

## 2015-05-23 LAB — GC/CHLAMYDIA PROBE AMP (~~LOC~~) NOT AT ARMC
Chlamydia: NEGATIVE
NEISSERIA GONORRHEA: NEGATIVE

## 2015-05-23 MED ORDER — SODIUM CHLORIDE 0.9% FLUSH
3.0000 mL | Freq: Two times a day (BID) | INTRAVENOUS | Status: DC
  Administered 2015-05-24 – 2015-07-03 (×79): 3 mL via INTRAVENOUS

## 2015-05-23 NOTE — Progress Notes (Signed)
Patient ID: Yolanda Berg, female   DOB: 1985-06-02, 30 y.o.   MRN: [id number] HD 3 PPROM 27 3/7 weeks breech  Pt doing well.  No contractions.  +LOF +FM  NST category 1 q shift  Abdomen NT  S/p betamethasone x 2   (2/11 and 2/12) Still on antibiotics (ampicillin and received azithromycin) Breech on [place] 05/21/15  Knows delivery plan is c-section unless baby converts to vertex NICU consult done

## 2015-05-24 LAB — TYPE AND SCREEN
ABO/RH(D): O POS
Antibody Screen: NEGATIVE

## 2015-05-24 NOTE — Progress Notes (Signed)
HD #4 [redacted]W[redacted]D, PPROM, breech Feels ok, still leaking some, no ctx Afeb, VSS FHT- Cat I Fundus NT Continue abx and observation

## 2015-05-25 NOTE — Progress Notes (Signed)
HD #5 [redacted]W[redacted]D, PPROM, breech Feels ok, still leaking some, no ctx Afeb, VSS FHT- Reassuring for 27 weeks, mod variability, occasional variable decel Fundus NT Continue abx and observation

## 2015-05-26 MED ORDER — PSEUDOEPHEDRINE HCL 30 MG PO TABS
30.0000 mg | ORAL_TABLET | Freq: Four times a day (QID) | ORAL | Status: DC | PRN
  Administered 2015-05-26 – 2015-05-27 (×4): 30 mg via ORAL
  Filled 2015-05-26 (×4): qty 1

## 2015-05-26 MED ORDER — SALINE SPRAY 0.65 % NA SOLN
1.0000 | NASAL | Status: DC | PRN
  Administered 2015-05-26: 1 via NASAL
  Filled 2015-05-26: qty 44

## 2015-05-26 NOTE — Progress Notes (Signed)
Initial Nutrition Assessment  DOCUMENTATION CODES:   Not applicable  INTERVENTION:  Gluten free diet, may order snacks TID, double protein portions  NUTRITION DIAGNOSIS:   Increased nutrient needs related to  (pregnancy and fetal growth requirements) as evidenced by  (27 weeks IUP).  GOAL:  Patient will meet greater than or equal to 90% of their needs  MONITOR:   Weight trends REASON FOR ASSESSMENT:  Antenatal   ASSESSMENT:   27 5/7 weeks with PROM. no reports of nausea, tol diet well. Follows gluten free diet, celiac's disease. Husband brings food from home, pt has snacks in room, minimal ordering from menu. Pre-preg BMI 23.5. 13 lb weight gain. weight gain goals 25-35 lbs  Diet Order:  Diet regular Room service appropriate?: Yes; Fluid consistency:: Thin Skin:  Reviewed, no issues  Height:   Ht Readings from Last 1 Encounters:  05/21/15  (1.753 m)   Weight:   Wt Readings from Last 1 Encounters:  05/25/15 172 lb 9.6 oz (78.291 kg)   Ideal Body Weight:    145 lbs  BMI:  Body mass index is 25.48 kg/(m^2).  Estimated Nutritional Needs:   Kcal:   2000- 2200   Protein:    89-99 g  Fluid:   2.3 L  EDUCATION NEEDS:   No education needs identified at this time  Inez Pilgrim.Odis Luster LDN Neonatal Nutrition Support Specialist/RD III Pager [phone]      Phone [phone]

## 2015-05-26 NOTE — Progress Notes (Signed)
HD #6 [redacted]W[redacted]D, PPROM, breech Feels ok, still leaking some, no ctx Afeb, VSS FHT- Reassuring for 27 weeks, mod variability, occasional variable decel, + accels Fundus NT Continue abx and observation

## 2015-05-27 LAB — TYPE AND SCREEN
ABO/RH(D): O POS
Antibody Screen: NEGATIVE

## 2015-05-27 NOTE — Progress Notes (Signed)
HD #7 [redacted]W[redacted]D, PPROM, breech Feels ok, still leaking some, no ctx Afeb, VSS FHT- Reassuring for 27 weeks, mod variability, occasional variable decel, + accels Fundus NT Continue abx and observation

## 2015-05-28 LAB — CBC WITH DIFFERENTIAL/PLATELET
Basophils Absolute: 0 10*3/uL (ref 0.0–0.1)
Basophils Relative: 0 %
EOS ABS: 0.1 10*3/uL (ref 0.0–0.7)
EOS PCT: 1 %
HCT: 39.2 % (ref 36.0–46.0)
Hemoglobin: 13.4 g/dL (ref 12.0–15.0)
LYMPHS ABS: 2.1 10*3/uL (ref 0.7–4.0)
Lymphocytes Relative: 20 %
MCH: 29.6 pg (ref 26.0–34.0)
MCHC: 34.2 g/dL (ref 30.0–36.0)
MCV: 86.5 fL (ref 78.0–100.0)
MONOS PCT: 8 %
Monocytes Absolute: 0.8 10*3/uL (ref 0.1–1.0)
Neutro Abs: 7.5 10*3/uL (ref 1.7–7.7)
Neutrophils Relative %: 71 %
PLATELETS: 194 10*3/uL (ref 150–400)
RBC: 4.53 MIL/uL (ref 3.87–5.11)
RDW: 13.2 % (ref 11.5–15.5)
WBC: 10.6 10*3/uL — ABNORMAL HIGH (ref 4.0–10.5)

## 2015-05-28 NOTE — Progress Notes (Signed)
Patient ID: Yolanda Berg, female   DOB: 1985/10/12, 30 y.o.   MRN: [id number] HD#8 Pt doing well. Denies any fever, chills or cramps. +Fms. Has not felt much leakage in past two days.  VSS CAT 1 strip  ABD - c/w ga and soft EXT- no Homans  A/P: IUP at [redacted] weeks gestation with PPROM; s/p steroids and antibiotic course         OK for wheelchair ride daily         Will recheck cbc today         Continue with management plan

## 2015-05-29 NOTE — Progress Notes (Signed)
Patient ID: Yolanda Berg, female   DOB: 1985-11-03, 30 y.o.   MRN: [id number] Pt doing well. Did get emotional yesterday - felt overwhelmed but better today. Reports some low abdominal pelvic cramps this am with increased burts of LOF. No fever or chills or VB. +Fms  VSS-afebrile ABD- soft  EXT- no Homans SVE- closed, thick, high, posterior, firm; pad slightly damp  WBC - 10.8 ( 05/28/15  A/P: IUP at 28 1/[redacted]wks gestation with PPROM        NST now to monitor for contractions as well        Will consider repeat AFI this week        No s/sx of infection        Continue with care as previously planned ( delivery at [redacted]weeks gestation with repeat betamethasone if immiment delivery <32weeks)

## 2015-05-30 LAB — TYPE AND SCREEN
ABO/RH(D): O POS
Antibody Screen: NEGATIVE

## 2015-05-30 MED ORDER — DIPHENHYDRAMINE HCL 25 MG PO CAPS
25.0000 mg | ORAL_CAPSULE | Freq: Four times a day (QID) | ORAL | Status: DC | PRN
  Administered 2015-05-30: 25 mg via ORAL
  Filled 2015-05-30: qty 1

## 2015-05-30 NOTE — Progress Notes (Signed)
HD #10 [redacted]W[redacted]D, PPROM, breech Feels ok, still leaking some, no ctx Afeb, VSS FHT- Reassuring for 28 weeks, mod variability, occasional variable decel, + accels Fundus NT Continue observation

## 2015-05-31 NOTE — Progress Notes (Signed)
HD #11 [redacted]W[redacted]D, PPROM, breech Feels ok, nothing new Afeb, VSS FHT- Reassuring for 28 weeks, mod variability, occasional variable decel, + accels Fundus NT Continue observation

## 2015-06-01 NOTE — Progress Notes (Signed)
HD #12 [redacted]W[redacted]D, PPROM, breech Feels ok, nothing new Afeb, VSS FHT- Reassuring for 28 weeks, mod variability, occasional variable decel, + accels Fundus NT Continue observation, discussed delivery options

## 2015-06-02 LAB — TYPE AND SCREEN
ABO/RH(D): O POS
Antibody Screen: NEGATIVE

## 2015-06-02 NOTE — Progress Notes (Signed)
HD #13 [redacted]W[redacted]D, PPROM, breech Feels ok, leaking a little more fluid recently Afeb, VSS FHT- Reassuring for 28 weeks, mod variability, occasional variable decel, + accels Fundus NT Continue observation

## 2015-06-03 NOTE — Progress Notes (Signed)
HD #14 [redacted]W[redacted]D, PPROM, breech Feels ok, upset that her child can't visit for now because of flu/RXV restrictions Afeb, VSS FHT- Reactive Fundus NT Continue observation, ok to go outside in wheelchair

## 2015-06-04 NOTE — Progress Notes (Addendum)
Patient ID: Yolanda Berg, female   DOB: Aug 22, 1985, 30 y.o.   MRN: [id number]  Admitted with PPROM s/p abx and BMZ GA [redacted]wk  No c/o's.  +FM, cont LOF, no VB, occ ctx  AFVSS gen NAD  FHTs 150's good var, category 1 toco none  Continue close monitoring Sad that kids are restricted from visiting.  Working w nurses to visit in courtyard with son

## 2015-06-05 LAB — TYPE AND SCREEN
ABO/RH(D): O POS
Antibody Screen: NEGATIVE

## 2015-06-05 LAB — CBC
HEMATOCRIT: 38.2 % (ref 36.0–46.0)
HEMOGLOBIN: 13.4 g/dL (ref 12.0–15.0)
MCH: 30 pg (ref 26.0–34.0)
MCHC: 35.1 g/dL (ref 30.0–36.0)
MCV: 85.7 fL (ref 78.0–100.0)
Platelets: 181 10*3/uL (ref 150–400)
RBC: 4.46 MIL/uL (ref 3.87–5.11)
RDW: 13.1 % (ref 11.5–15.5)
WBC: 9.5 10*3/uL (ref 4.0–10.5)

## 2015-06-05 NOTE — Progress Notes (Addendum)
Patient ID: Yolanda Berg, female   DOB: 03-Feb-1986, 30 y.o.   MRN: [id number]  29+1 PPROM - s/p BMZ and abx  No c/o's, +FM, cont LOF, no VB, rare ctx; some abd tenderness this am, RLQ pain  AFVSS gen NAD Abd soft, gravid FNT, TTP RLQ FHT 150, good var, category 1 toco none  Continue current mgmt, close monitoring D/w pt possible LTCS today - d/w pt r/b/a Pt encouraged to eat only clear liquid diet CBC

## 2015-06-06 NOTE — Progress Notes (Signed)
Patient ID: Yolanda Berg, female   DOB: 19-Mar-1986, 31 y.o.   MRN: [id number] Pt seen this am. Feels well - had ctxs/crampy pain yesterday but resolved; none today. Denies any LOF, Vb or fever/chills. +Fms.  VSS ABD- consistent with ga  A/P: IUP at 50 2/[redacted]wks gestation with PPROM s/p BMZ and antibx - stable        Continue with close observation

## 2015-06-07 NOTE — Progress Notes (Addendum)
Patient ID: Yolanda Berg, female   DOB: 12/19/1985, 30 y.o.   MRN: [id number] 29 3/7 weeks PPROM/breech  Pt feeling ok.  No further cramping but LOF has picked up some.   NST reactive with occasional variables  afeb vss  Abd NT  S/p abx S.p betamethasone 05/21/15 and 05/22/15 Last [place] 05/21/15, will plan later this week to f/u growth

## 2015-06-08 LAB — TYPE AND SCREEN
ABO/RH(D): O POS
Antibody Screen: NEGATIVE

## 2015-06-08 NOTE — Progress Notes (Signed)
Patient ID: Yolanda Berg, female   DOB: 06-26-1985, 30 y.o.   MRN: [id number] 29 4/7 weeks PPROM/breech  Pt feeling ok. No cramping, +LOF  NST reactive for gestational age with occasional variables  afeb vss  Abd NT  S/p abx S.p betamethasone 05/21/15 and 05/22/15 Last [place] 05/21/15, ordered for 06/09/15 for growth

## 2015-06-09 ENCOUNTER — Ambulatory Visit (HOSPITAL_COMMUNITY): Payer: BLUE CROSS/BLUE SHIELD

## 2015-06-09 DIAGNOSIS — Z36 Encounter for antenatal screening of mother: Secondary | ICD-10-CM | POA: Insufficient documentation

## 2015-06-09 DIAGNOSIS — O4292 Full-term premature rupture of membranes, unspecified as to length of time between rupture and onset of labor: Secondary | ICD-10-CM | POA: Insufficient documentation

## 2015-06-09 DIAGNOSIS — Z3A29 29 weeks gestation of pregnancy: Secondary | ICD-10-CM

## 2015-06-09 NOTE — Progress Notes (Signed)
Patient ID: Analyssa Downs, female   DOB: 1985/12/11, 29 y.o.   MRN: [id number] 29 5/7 weeks PPROM/breech  Pt feeling ok. No cramping, +LOF  NST reactive for gestational age with occasional variables  afeb vss  Abd NT  S/p abx S.p betamethasone 05/21/15 and 05/22/15 Last [place] 05/21/15, repeat today for growth

## 2015-06-10 NOTE — Progress Notes (Signed)
Patient ID: Yolanda KehrFelicia Berg, female   DOB: [birth year], 30 y.o.   MRN: [id number] 29 6//7 weeks PPROM/breech  Pt feeling ok. No cramping, +LOF  NST reactive for gestational age with occasional variables  afeb vss  Abd NT  S/p abx S.p betamethasone 05/21/15 and 05/22/15 US yesterday showed normal growth at 31%ile, still breech

## 2015-06-11 LAB — TYPE AND SCREEN
ABO/RH(D): O POS
Antibody Screen: NEGATIVE

## 2015-06-11 NOTE — Progress Notes (Signed)
Patient ID: Yolanda KehrFelicia Berg, female   DOB: 10/22/1985, 30 y.o.   MRN: [id number] Pt doing well. Occasional LOF but no comtractions. +Fms. Denies fever/chills. In good spirits VSS ABD- c/w ga, NT EXT-  No Homans  A/P: 30 0/[redacted]wks ga with PPROM -stable         S/p abx  - will repeat cbc tomorrow         S/p betamethasone 05/21/15 and 05/22/15         Reassuring daily NSTS         Continue current management

## 2015-06-11 NOTE — MAU Note (Signed)
Pt not DC from obix. From 2133-2152 other pt was on the monitor

## 2015-06-12 LAB — CBC
HCT: 34.6 % — ABNORMAL LOW (ref 36.0–46.0)
Hemoglobin: 11.6 g/dL — ABNORMAL LOW (ref 12.0–15.0)
MCH: 28.8 pg (ref 26.0–34.0)
MCHC: 33.5 g/dL (ref 30.0–36.0)
MCV: 85.9 fL (ref 78.0–100.0)
PLATELETS: 153 10*3/uL (ref 150–400)
RBC: 4.03 MIL/uL (ref 3.87–5.11)
RDW: 12.9 % (ref 11.5–15.5)
WBC: 7.2 10*3/uL (ref 4.0–10.5)

## 2015-06-12 NOTE — Progress Notes (Signed)
Patient ID: Walker KehrFelicia Johnsey, female   DOB: 1985/12/10, 30 y.o.   MRN: [id number] Pt doing well. No complaints. Sitting up comfortably. No fever/chills or contractions.  VSS ABD- c/w ga EXT-no swelling  WBC- 7.3  A/P: 30 1/[redacted]wks ga with PPROM -stable wbc  S/p abx  S/p betamethasone 05/21/15 and 05/22/15  Reassuring daily NSTS  Continue current management

## 2015-06-13 NOTE — Progress Notes (Signed)
HD #24 [redacted]W[redacted]D, PPROM, breech Feels ok, no problems, still leaks some fluid Afeb, VSS FHT- Reactive Fundus NT Continue observation

## 2015-06-14 LAB — TYPE AND SCREEN
ABO/RH(D): O POS
ANTIBODY SCREEN: NEGATIVE

## 2015-06-14 NOTE — Progress Notes (Signed)
HD #25 [redacted]W[redacted]D, PPROM, breech Feels ok, no problems Afeb, VSS FHT- Reactive Fundus NT Continue observation, will do u/s at 32 weeks for growth

## 2015-06-15 NOTE — Progress Notes (Signed)
HD #26 [redacted]W[redacted]D, PPROM, breech Feels ok, no problems Afeb, VSS FHT- Reactive Fundus NT Continue observation

## 2015-06-16 NOTE — Progress Notes (Signed)
HD #27 [redacted]W[redacted]D, PPROM, breech Feels ok, no problems Afeb, VSS FHT- Reactive with some mild variable decels Fundus NT Continue observation

## 2015-06-17 LAB — TYPE AND SCREEN
ABO/RH(D): O POS
ANTIBODY SCREEN: NEGATIVE

## 2015-06-17 NOTE — Progress Notes (Signed)
HD #28 [redacted]W[redacted]D, PPROM, breech Feels ok, had more fluid leakage last pm Afeb, VSS FHT- Reactive with some mild variable decels Fundus NT Continue observation

## 2015-06-18 MED ORDER — PROMETHAZINE HCL 25 MG RE SUPP: 12.5000 mg | Freq: Three times a day (TID) | RECTAL | Status: DC | PRN

## 2015-06-18 NOTE — Progress Notes (Signed)
HD #29 [redacted]W[redacted]D, PPROM, breech Feels ok, had more fluid leakage last pm and had some ctx Afeb, VSS FHT- Reactive, no ctx this am Fundus NT Continue observation

## 2015-06-19 NOTE — Progress Notes (Signed)
HD #30 [redacted]W[redacted]D, PPROM, breech Feels ok, nothing new Afeb, VSS FHT- Reactive, occasional mild variable decel, one ctx this am Fundus NT Continue observation

## 2015-06-19 NOTE — Progress Notes (Signed)
End of daylight saving time.  

## 2015-06-20 LAB — TYPE AND SCREEN
ABO/RH(D): O POS
ANTIBODY SCREEN: NEGATIVE

## 2015-06-20 NOTE — Progress Notes (Signed)
HD #31 [redacted]W[redacted]D, PPROM, breech Feels ok, nothing new Afeb, VSS FHT- Reactive, occasional mild variable decel, one ctx on tracing last pm Fundus NT Continue observation

## 2015-06-21 NOTE — Progress Notes (Signed)
HD #32 102W3D, PPROM, breech Feels ok, nothing new Afeb, VSS FHT- Reactive Fundus NT Continue observation

## 2015-06-22 NOTE — Progress Notes (Signed)
HD #33 [redacted]W[redacted]D, PPROM, breech Feels ok, nothing new Afeb, VSS FHT- Reactive Fundus NT Continue observation

## 2015-06-23 LAB — TYPE AND SCREEN
ABO/RH(D): O POS
Antibody Screen: NEGATIVE

## 2015-06-23 NOTE — Progress Notes (Signed)
HD #34 [redacted]W[redacted]D, PPROM, breech Feels ok, nothing new Afeb, VSS FHT- Reactive, one ctx Fundus NT Continue observation, u/s ordered for 3-20 am

## 2015-06-24 NOTE — Progress Notes (Signed)
HD #35 [redacted]W[redacted]D, PPROM, breech Feels ok, nothing new Afeb, VSS FHT- Reactive, occ ctx Fundus NT Continue observation, u/s ordered for 3-20 am

## 2015-06-25 NOTE — Progress Notes (Signed)
Patient ID: Yolanda KehrFelicia Heid, female   DOB: 22-Feb-1986, 30 y.o.   MRN: [id number] HD #36      32 weeks! PPROM, breech Intermittent LOF, no ctx felt Afeb, VSS FHT- Reactive baseline 150 Fundus NT Continue observation US pending 06/27/15

## 2015-06-26 LAB — TYPE AND SCREEN
ABO/RH(D): O POS
ANTIBODY SCREEN: NEGATIVE

## 2015-06-26 NOTE — Progress Notes (Signed)
Patient ID: Walker KehrFelicia Krakow, female   DOB: 1985-12-29, 30 y.o.   MRN: [id number] HD #37 32 1/7 weeks PPROM, breech Some mild suprapubic tenderness, fundus NT, no ctx +LOF Afeb, VSS FHT- Reactive baseline 150, occasional mild variable Fundus NT Continue observation US pending 06/27/15

## 2015-06-27 ENCOUNTER — Inpatient Hospital Stay (HOSPITAL_COMMUNITY): Payer: BLUE CROSS/BLUE SHIELD

## 2015-06-27 ENCOUNTER — Encounter (HOSPITAL_COMMUNITY): Payer: Self-pay

## 2015-06-27 NOTE — Progress Notes (Signed)
Visit with Sunny SchleinFelicia who shared that she's glad she's been able to go this long and knows it's best for baby but is eager to get home.  She misses her son Fabian NovemberCreed, who is 3818 mos and feels like she's missing her last days of him being her only child.  She is [facility].    Please page as further needs arise.  Maryanna ShapeAmanda M. Carley Hammedavee Lomax, M.Div. [facility] Chaplain Pager [phone] Office [phone]      06/27/15 1611  Clinical Encounter Type  Visited With Patient  Visit Type Spiritual support

## 2015-06-27 NOTE — Progress Notes (Signed)
HD #38 [redacted]W[redacted]D, PPROM, breech Feels ok, nothing new Afeb, VSS FHT- Reactive, occ ctx, having more mild variable decels Fundus NT Continue observation, u/s today

## 2015-06-28 ENCOUNTER — Inpatient Hospital Stay (HOSPITAL_COMMUNITY): Payer: BLUE CROSS/BLUE SHIELD

## 2015-06-28 LAB — CBC
HEMATOCRIT: 35.1 % — AB (ref 36.0–46.0)
HEMOGLOBIN: 12.2 g/dL (ref 12.0–15.0)
MCH: 29.8 pg (ref 26.0–34.0)
MCHC: 34.8 g/dL (ref 30.0–36.0)
MCV: 85.8 fL (ref 78.0–100.0)
Platelets: 178 10*3/uL (ref 150–400)
RBC: 4.09 MIL/uL (ref 3.87–5.11)
RDW: 13.2 % (ref 11.5–15.5)
WBC: 9.2 10*3/uL (ref 4.0–10.5)

## 2015-06-28 MED ORDER — BUTORPHANOL TARTRATE 1 MG/ML IJ SOLN
0.5000 mg | INTRAMUSCULAR | Status: AC | PRN
  Administered 2015-06-28 – 2015-06-29 (×2): 0.5 mg via INTRAVENOUS
  Filled 2015-06-28 (×2): qty 1

## 2015-06-28 MED ORDER — SODIUM CHLORIDE 0.9 % IV SOLN
3.0000 g | Freq: Four times a day (QID) | INTRAVENOUS | Status: DC
  Administered 2015-06-28 – 2015-06-30 (×6): 3 g via INTRAVENOUS
  Filled 2015-06-28 (×7): qty 3

## 2015-06-28 MED ORDER — NIFEDIPINE 10 MG PO CAPS
10.0000 mg | ORAL_CAPSULE | Freq: Four times a day (QID) | ORAL | Status: DC
  Administered 2015-06-28 – 2015-06-29 (×2): 10 mg via ORAL
  Filled 2015-06-28 (×2): qty 1

## 2015-06-28 MED ORDER — LACTATED RINGERS IV BOLUS (SEPSIS)
1000.0000 mL | Freq: Once | INTRAVENOUS | Status: AC
  Administered 2015-06-28: 1000 mL via INTRAVENOUS

## 2015-06-28 MED ORDER — TERBUTALINE SULFATE 1 MG/ML IJ SOLN
0.2500 mg | Freq: Once | INTRAMUSCULAR | Status: AC
  Administered 2015-06-28: 0.25 mg via SUBCUTANEOUS
  Filled 2015-06-28: qty 1

## 2015-06-28 NOTE — Progress Notes (Signed)
Patient ID: Walker KehrFelicia Berg, female   DOB: 24-May-1985, 30 y.o.   MRN: [id number] Pt was doing well when seen at 1730 pm- denied contractions, VB or LOF. Was appreciating Fms About 1-2 hours later pt states was still in bed when felt a large gush of clear fluid that soaked her pants. She begun having mild irregular contractions shortly afterwards. Pt was started on an LR iv bolus. She continued to have LOF and contractions.  Bedside u/s was done which noted afi of 9cm and baby still breech Pt given one dose of terbutaline and started on 10mg  procardia q 6 hours  Wbc 13 Afebrile SVE is 1/50/-3 ( last check was on admission and was closed)  A/P: HD#39; IUP at 32 3/[redacted]wks ga with PPROM, breech and now preterm contractions         Continue ivf, procardia 10mg  q 6  S/p abxcourse a month ago  S/p betamethasone 05/21/15 and 05/22/15         If labor ensues despite tocolysis will proceed with pltc/s

## 2015-06-28 NOTE — Progress Notes (Signed)
HD #39 [redacted]W[redacted]D, PPROM, breech Feels ok, nothing new Afeb, VSS FHT- Reactive, occ ctx, occ variable decel Fundus NT Continue observation, u/s report pending but was still breech-will work on date for c-section around 34 weeks

## 2015-06-28 NOTE — Progress Notes (Signed)
Patient ID: Yolanda KehrFelicia Berg, female   DOB: March 18, 1986, 30 y.o.   MRN: [id number] Pt reports continuing to feel low back pain and tightening that radiates into pubic symphysis regularly. Feels LOF with each episode of tightening. Did not like the way terbutaline made her feel  SVE-unchanged 1cm and high  Plan: 32 3/7wks PPROM, breech, preterm contractions          Continue ivf          Start antibiotics          Stadol for pain control          Due for next dose of procardia in 2-3hours          Will consider indocin

## 2015-06-29 LAB — TYPE AND SCREEN
ABO/RH(D): O POS
ANTIBODY SCREEN: NEGATIVE

## 2015-06-29 LAB — MAGNESIUM: MAGNESIUM: 4.7 mg/dL — AB (ref 1.7–2.4)

## 2015-06-29 MED ORDER — CYCLOBENZAPRINE HCL 10 MG PO TABS
10.0000 mg | ORAL_TABLET | Freq: Three times a day (TID) | ORAL | Status: DC | PRN
  Administered 2015-06-29: 10 mg via ORAL
  Filled 2015-06-29 (×2): qty 1

## 2015-06-29 MED ORDER — ONDANSETRON 4 MG PO TBDP
4.0000 mg | ORAL_TABLET | Freq: Four times a day (QID) | ORAL | Status: DC | PRN
  Administered 2015-06-29: 4 mg via ORAL
  Filled 2015-06-29 (×2): qty 1

## 2015-06-29 MED ORDER — MAGNESIUM SULFATE 50 % IJ SOLN
1.0000 g/h | INTRAVENOUS | Status: DC
  Filled 2015-06-29: qty 80

## 2015-06-29 MED ORDER — MAGNESIUM SULFATE BOLUS VIA INFUSION
6.0000 g | Freq: Once | INTRAVENOUS | Status: AC
  Administered 2015-06-29: 6 g via INTRAVENOUS
  Filled 2015-06-29: qty 500

## 2015-06-29 MED ORDER — BETAMETHASONE SOD PHOS & ACET 6 (3-3) MG/ML IJ SUSP
12.0000 mg | Freq: Every day | INTRAMUSCULAR | Status: AC
  Administered 2015-06-29 – 2015-06-30 (×2): 12 mg via INTRAMUSCULAR
  Filled 2015-06-29 (×2): qty 2

## 2015-06-29 MED ORDER — LACTATED RINGERS IV SOLN
INTRAVENOUS | Status: DC
  Administered 2015-06-29 – 2015-07-03 (×6): via INTRAVENOUS

## 2015-06-29 NOTE — Progress Notes (Signed)
Ctx still improved, will d/c magnesium

## 2015-06-29 NOTE — Progress Notes (Signed)
Patient ID: Yolanda KehrFelicia Novakowski, female   DOB: May 20, 1985, 30 y.o.   MRN: [id number] Pt continued to have contractions approx every 5 mins hence given 10 mg procardia at 4 hrs instead of 6 hours from last dose. She was also given stadol 1 mg. Denied fever or chills. Cervix unchanged still at 1 cm and -3 station An hour afterwards contractions spaced out to 8-359mins but pt reports increasing discomfort with contractions.  Cervix still unchanged Recommend starting with MgSo4 for neuroprotection  Continue ivf and antibx If begins to make cervical change will proceed with c/s

## 2015-06-29 NOTE — Progress Notes (Addendum)
Patient ID: Yolanda KehrFelicia Berg, female   DOB: [birth year], 30 y.o.   MRN: [id number] Pt reports contractions have improved significantly when occur but is feeling nauseated- last ate at noon yesterday. Feels foggy on magnesium sulfate. VSS ABD- soft EXT - no Homans  EFM- cat 1 140, moderate variability, +accels and no decels TOCO- rare contraction q 30-5640mins SVE - deferred ( was 1cm last check)  A/P: HD #40 IUP at 32 4/[redacted]wks ga with PPROM, breech, preterm contractions now on magnesium                 sulfate and unasyn; afeb          S/p steroids and antibx course         Mag level at 0800am;          If contractions resume and progresses to labor with baby breech will need c/s         Consider decrease magnesium sulfate level q hour; consider d/c at 12 vs 24hrs

## 2015-06-29 NOTE — Progress Notes (Signed)
Off magnesium, feeling ok, no increase in ctx, back pain is improved Will continue observation for now, continue Unasyn and IV fluids for now

## 2015-06-29 NOTE — Progress Notes (Signed)
Patient ID: Yolanda KehrFelicia Berg, female   DOB: 1986/02/24, 30 y.o.   MRN: [id number] Contractions spacing out. Stadol also helping with pain control. Pt now c/o nausea.  Cat 1 strip  SVE deferred  HD#40, s/p PPROM-afeb, s/p betamethasone course  Will continue with MgSO4, ivf, unasyn Will add zofran odt for nausea Check cervix if contractions get closer/increased pressure If active labor and still breech will proceed with pltc/s

## 2015-06-29 NOTE — Progress Notes (Signed)
HD #40 [redacted]W[redacted]D, PPROM, breech Having back pain, ctx currently better, having some bloody discharge Afeb, VSS FHT- Reactive, irreg ctx, occ variable decel Fundus NT Currently on magnesium 1 gm/hr and Unasyn.  Will repeat betamethasone since it has been since admission, will consider d/c magnesium at noon and see how she does, repeat c-section if continues to have ctx.

## 2015-06-30 NOTE — Progress Notes (Signed)
HD #41 [redacted]W[redacted]D, PPROM, breech Feeling better today, no significant ctx Afeb, VSS FHT- Reactive, rare ctx Fundus NT Will d/c Unasyn, continue observation, will schedule c-section for 4-2 if not delivered before then

## 2015-06-30 NOTE — Progress Notes (Signed)
Patient ID: Yolanda Berg, female   DOB: 1985/06/26, 30 y.o.   MRN: [id number] Pt subdued, but understandable.  Just tired of hospital +LOF, minimal contractions afeb vss FHR category 1 with occasional mild variables Fundus NT  Off abx, will saline lock IV S/p betamethasone #2

## 2015-06-30 NOTE — Progress Notes (Signed)
Pt states she's tearful today due to the steroids she received; states she gets "emotional" every time she gets something like a steroid including prednisone.

## 2015-07-01 NOTE — Progress Notes (Signed)
Pt just returned back to unit from visiting family.

## 2015-07-01 NOTE — Progress Notes (Signed)
[facility]

## 2015-07-01 NOTE — Progress Notes (Signed)
HD #42 [redacted]W[redacted]D, PPROM, breech Feeling better today, no significant ctx, some lower abdominal discomfort and pinkish discharge Afeb, VSS FHT- Reactive, rare ctx Fundus NT Continue observation, discussed c-section procedure and risks for when it happens

## 2015-07-02 LAB — TYPE AND SCREEN
ABO/RH(D): O POS
Antibody Screen: NEGATIVE

## 2015-07-02 LAB — CBC
HEMATOCRIT: 35.7 % — AB (ref 36.0–46.0)
HEMOGLOBIN: 12.1 g/dL (ref 12.0–15.0)
MCH: 29.4 pg (ref 26.0–34.0)
MCHC: 33.9 g/dL (ref 30.0–36.0)
MCV: 86.7 fL (ref 78.0–100.0)
Platelets: 156 10*3/uL (ref 150–400)
RBC: 4.12 MIL/uL (ref 3.87–5.11)
RDW: 13.5 % (ref 11.5–15.5)
WBC: 11.9 10*3/uL — ABNORMAL HIGH (ref 4.0–10.5)

## 2015-07-02 NOTE — Progress Notes (Signed)
Patient ID: Yolanda KehrFelicia Mullin, female   DOB: [birth year], 30 y.o.   MRN: [id number]  +FM, gushes of fluid over the night, still pinkish with bleed flecks, no frank VB, occ ctx, feels achy at lower abdomen  AFVSS gen NAD Abd soft, FNT FHTs 150's good var, category 1 toco rare  Will add CBC Close monitoring PPROM 2/17, s/p BMZ x 2, abx

## 2015-07-03 ENCOUNTER — Encounter (HOSPITAL_COMMUNITY): Payer: Self-pay | Admitting: Anesthesiology

## 2015-07-03 ENCOUNTER — Inpatient Hospital Stay (HOSPITAL_COMMUNITY): Payer: BLUE CROSS/BLUE SHIELD | Admitting: Anesthesiology

## 2015-07-03 ENCOUNTER — Encounter (HOSPITAL_COMMUNITY)
Admission: AD | Disposition: A | Discharge status: Home / Self Care | Payer: Self-pay | Source: Ambulatory Visit | Attending: Obstetrics and Gynecology

## 2015-07-03 DIAGNOSIS — Z98891 History of uterine scar from previous surgery: Secondary | ICD-10-CM

## 2015-07-03 HISTORY — DX: History of uterine scar from previous surgery: Z98.891

## 2015-07-03 SURGERY — Surgical Case
Anesthesia: Spinal

## 2015-07-03 MED ORDER — KETOROLAC TROMETHAMINE 30 MG/ML IJ SOLN
30.0000 mg | Freq: Four times a day (QID) | INTRAMUSCULAR | Status: DC | PRN
  Filled 2015-07-03: qty 1

## 2015-07-03 MED ORDER — NALBUPHINE HCL 10 MG/ML IJ SOLN: 5.0000 mg | INTRAMUSCULAR | Status: DC | PRN

## 2015-07-03 MED ORDER — FENTANYL CITRATE (PF) 100 MCG/2ML IJ SOLN
INTRAMUSCULAR | Status: DC | PRN
  Administered 2015-07-03: 25 ug via INTRATHECAL

## 2015-07-03 MED ORDER — NALBUPHINE HCL 10 MG/ML IJ SOLN: 5.0000 mg | Freq: Once | INTRAMUSCULAR | Status: DC | PRN

## 2015-07-03 MED ORDER — NALOXONE HCL 2 MG/2ML IJ SOSY: 1.0000 ug/kg/h | PREFILLED_SYRINGE | INTRAMUSCULAR | Status: DC | PRN

## 2015-07-03 MED ORDER — MEPERIDINE HCL 25 MG/ML IJ SOLN: 6.2500 mg | INTRAMUSCULAR | Status: DC | PRN

## 2015-07-03 MED ORDER — DIPHENHYDRAMINE HCL 25 MG PO CAPS
25.0000 mg | ORAL_CAPSULE | ORAL | Status: DC | PRN
  Filled 2015-07-03 (×4): qty 1

## 2015-07-03 MED ORDER — SODIUM CHLORIDE 0.9% FLUSH: 3.0000 mL | INTRAVENOUS | Status: DC | PRN

## 2015-07-03 MED ORDER — FENTANYL CITRATE (PF) 100 MCG/2ML IJ SOLN
INTRAMUSCULAR | Status: AC
  Filled 2015-07-03: qty 2

## 2015-07-03 MED ORDER — OXYTOCIN 10 UNIT/ML IJ SOLN
40.0000 [IU] | INTRAVENOUS | Status: DC | PRN
  Administered 2015-07-03: 40 [IU] via INTRAVENOUS

## 2015-07-03 MED ORDER — ONDANSETRON HCL 4 MG/2ML IJ SOLN
INTRAMUSCULAR | Status: AC
  Filled 2015-07-03: qty 2

## 2015-07-03 MED ORDER — SCOPOLAMINE 1 MG/3DAYS TD PT72: 1.0000 | MEDICATED_PATCH | Freq: Once | TRANSDERMAL | Status: DC

## 2015-07-03 MED ORDER — MENTHOL 3 MG MT LOZG
1.0000 | LOZENGE | OROMUCOSAL | Status: DC | PRN
Start: 2015-07-03 — End: 2015-07-05

## 2015-07-03 MED ORDER — LACTATED RINGERS IV SOLN: INTRAVENOUS | Status: DC

## 2015-07-03 MED ORDER — CEFAZOLIN SODIUM-DEXTROSE 2-4 GM/100ML-% IV SOLN
INTRAVENOUS | Status: AC
  Filled 2015-07-03: qty 100

## 2015-07-03 MED ORDER — SIMETHICONE 80 MG PO CHEW
80.0000 mg | CHEWABLE_TABLET | Freq: Three times a day (TID) | ORAL | Status: DC
  Administered 2015-07-04 – 2015-07-05 (×4): 80 mg via ORAL
  Filled 2015-07-03 (×4): qty 1

## 2015-07-03 MED ORDER — DIPHENHYDRAMINE HCL 25 MG PO CAPS
25.0000 mg | ORAL_CAPSULE | Freq: Four times a day (QID) | ORAL | Status: DC | PRN
  Administered 2015-07-03 – 2015-07-04 (×3): 25 mg via ORAL

## 2015-07-03 MED ORDER — NALOXONE HCL 0.4 MG/ML IJ SOLN: 0.4000 mg | INTRAMUSCULAR | Status: DC | PRN

## 2015-07-03 MED ORDER — PHENYLEPHRINE 8 MG IN D5W 100 ML (0.08MG/ML) PREMIX OPTIME
INJECTION | INTRAVENOUS | Status: DC | PRN
  Administered 2015-07-03: 60 ug/min via INTRAVENOUS

## 2015-07-03 MED ORDER — CEFAZOLIN SODIUM-DEXTROSE 2-4 GM/100ML-% IV SOLN: 2.0000 g | INTRAVENOUS | Status: DC

## 2015-07-03 MED ORDER — BUPIVACAINE IN DEXTROSE 0.75-8.25 % IT SOLN
INTRATHECAL | Status: DC | PRN
  Administered 2015-07-03: 1.6 mL via INTRATHECAL

## 2015-07-03 MED ORDER — ZOLPIDEM TARTRATE 5 MG PO TABS: 5.0000 mg | ORAL_TABLET | Freq: Every evening | ORAL | Status: DC | PRN

## 2015-07-03 MED ORDER — ONDANSETRON HCL 4 MG/2ML IJ SOLN: 4.0000 mg | Freq: Three times a day (TID) | INTRAMUSCULAR | Status: DC | PRN

## 2015-07-03 MED ORDER — ACETAMINOPHEN 500 MG PO TABS: 1000.0000 mg | ORAL_TABLET | Freq: Four times a day (QID) | ORAL | Status: DC

## 2015-07-03 MED ORDER — LACTATED RINGERS IV BOLUS (SEPSIS)
500.0000 mL | Freq: Once | INTRAVENOUS | Status: AC
  Administered 2015-07-03: 500 mL via INTRAVENOUS

## 2015-07-03 MED ORDER — MEPERIDINE HCL 25 MG/ML IJ SOLN
INTRAMUSCULAR | Status: DC | PRN
  Administered 2015-07-03: 12.5 mg via INTRAVENOUS

## 2015-07-03 MED ORDER — CITRIC ACID-SODIUM CITRATE 334-500 MG/5ML PO SOLN
30.0000 mL | Freq: Once | ORAL | Status: AC
  Administered 2015-07-03: 30 mL via ORAL

## 2015-07-03 MED ORDER — PRENATAL MULTIVITAMIN CH
1.0000 | ORAL_TABLET | Freq: Every day | ORAL | Status: DC
  Administered 2015-07-04 – 2015-07-05 (×2): 1 via ORAL
  Filled 2015-07-03 (×2): qty 1

## 2015-07-03 MED ORDER — MORPHINE SULFATE (PF) 0.5 MG/ML IJ SOLN
INTRAMUSCULAR | Status: AC
  Filled 2015-07-03: qty 10

## 2015-07-03 MED ORDER — WITCH HAZEL-GLYCERIN EX PADS: 1.0000 "application " | MEDICATED_PAD | CUTANEOUS | Status: DC | PRN

## 2015-07-03 MED ORDER — OXYCODONE-ACETAMINOPHEN 5-325 MG PO TABS
1.0000 | ORAL_TABLET | ORAL | Status: DC | PRN
  Administered 2015-07-04 (×2): 1 via ORAL
  Filled 2015-07-03 (×2): qty 1

## 2015-07-03 MED ORDER — IBUPROFEN 600 MG PO TABS
600.0000 mg | ORAL_TABLET | Freq: Four times a day (QID) | ORAL | Status: DC | PRN
  Administered 2015-07-04: 600 mg via ORAL
  Filled 2015-07-03: qty 1

## 2015-07-03 MED ORDER — OXYCODONE-ACETAMINOPHEN 5-325 MG PO TABS
2.0000 | ORAL_TABLET | ORAL | Status: DC | PRN
  Administered 2015-07-04 – 2015-07-05 (×4): 2 via ORAL
  Filled 2015-07-03 (×4): qty 2

## 2015-07-03 MED ORDER — DIPHENHYDRAMINE HCL 50 MG/ML IJ SOLN: 12.5000 mg | INTRAMUSCULAR | Status: DC | PRN

## 2015-07-03 MED ORDER — IBUPROFEN 800 MG PO TABS
800.0000 mg | ORAL_TABLET | Freq: Three times a day (TID) | ORAL | Status: DC
  Administered 2015-07-03 – 2015-07-05 (×4): 800 mg via ORAL
  Filled 2015-07-03 (×4): qty 1

## 2015-07-03 MED ORDER — ACETAMINOPHEN 325 MG PO TABS: 650.0000 mg | ORAL_TABLET | ORAL | Status: DC | PRN

## 2015-07-03 MED ORDER — OXYTOCIN 10 UNIT/ML IJ SOLN
INTRAMUSCULAR | Status: AC
  Filled 2015-07-03: qty 4

## 2015-07-03 MED ORDER — OXYTOCIN 10 UNIT/ML IJ SOLN: 2.5000 [IU]/h | INTRAVENOUS | Status: AC

## 2015-07-03 MED ORDER — SIMETHICONE 80 MG PO CHEW
80.0000 mg | CHEWABLE_TABLET | ORAL | Status: DC
  Administered 2015-07-03 – 2015-07-04 (×2): 80 mg via ORAL
  Filled 2015-07-03 (×2): qty 1

## 2015-07-03 MED ORDER — KETOROLAC TROMETHAMINE 30 MG/ML IJ SOLN
INTRAMUSCULAR | Status: AC
  Filled 2015-07-03: qty 1

## 2015-07-03 MED ORDER — LANOLIN HYDROUS EX OINT: 1.0000 "application " | TOPICAL_OINTMENT | CUTANEOUS | Status: DC | PRN

## 2015-07-03 MED ORDER — SIMETHICONE 80 MG PO CHEW: 80.0000 mg | CHEWABLE_TABLET | ORAL | Status: DC | PRN

## 2015-07-03 MED ORDER — LACTATED RINGERS IV SOLN
INTRAVENOUS | Status: DC | PRN
  Administered 2015-07-03: 16:00:00 via INTRAVENOUS

## 2015-07-03 MED ORDER — SENNOSIDES-DOCUSATE SODIUM 8.6-50 MG PO TABS
2.0000 | ORAL_TABLET | ORAL | Status: DC
  Administered 2015-07-03 – 2015-07-04 (×2): 2 via ORAL
  Filled 2015-07-03 (×2): qty 2

## 2015-07-03 MED ORDER — CEFAZOLIN SODIUM-DEXTROSE 2-3 GM-% IV SOLR
INTRAVENOUS | Status: DC | PRN
  Administered 2015-07-03: 2 g via INTRAVENOUS

## 2015-07-03 MED ORDER — TETANUS-DIPHTH-ACELL PERTUSSIS 5-2.5-18.5 LF-MCG/0.5 IM SUSP
0.5000 mL | Freq: Once | INTRAMUSCULAR | Status: AC
  Administered 2015-07-05: 0.5 mL via INTRAMUSCULAR
  Filled 2015-07-03: qty 0.5

## 2015-07-03 MED ORDER — KETOROLAC TROMETHAMINE 30 MG/ML IJ SOLN: 30.0000 mg | Freq: Once | INTRAMUSCULAR | Status: DC

## 2015-07-03 MED ORDER — CALCIUM CARBONATE ANTACID 500 MG PO CHEW: 1.0000 | CHEWABLE_TABLET | ORAL | Status: DC | PRN

## 2015-07-03 MED ORDER — PROMETHAZINE HCL 25 MG/ML IJ SOLN: 6.2500 mg | INTRAMUSCULAR | Status: DC | PRN

## 2015-07-03 MED ORDER — MORPHINE SULFATE (PF) 0.5 MG/ML IJ SOLN
INTRAMUSCULAR | Status: DC | PRN
  Administered 2015-07-03: .2 mg via INTRATHECAL

## 2015-07-03 MED ORDER — HYDROMORPHONE HCL 1 MG/ML IJ SOLN: 0.2500 mg | INTRAMUSCULAR | Status: DC | PRN

## 2015-07-03 MED ORDER — MEPERIDINE HCL 25 MG/ML IJ SOLN
INTRAMUSCULAR | Status: AC
  Filled 2015-07-03: qty 1

## 2015-07-03 MED ORDER — ONDANSETRON HCL 4 MG/2ML IJ SOLN
INTRAMUSCULAR | Status: DC | PRN
  Administered 2015-07-03: 4 mg via INTRAVENOUS

## 2015-07-03 MED ORDER — DIBUCAINE 1 % RE OINT: 1.0000 "application " | TOPICAL_OINTMENT | RECTAL | Status: DC | PRN

## 2015-07-03 MED ORDER — CITRIC ACID-SODIUM CITRATE 334-500 MG/5ML PO SOLN
ORAL | Status: AC
  Administered 2015-07-03: 30 mL
  Filled 2015-07-03: qty 15

## 2015-07-03 SURGICAL SUPPLY — 33 items
BENZOIN TINCTURE PRP APPL 2/3 (GAUZE/BANDAGES/DRESSINGS) ×2 IMPLANT
CLAMP CORD UMBIL (MISCELLANEOUS) IMPLANT
CLOSURE STERI STRIP 1/2 X4 (GAUZE/BANDAGES/DRESSINGS) ×2 IMPLANT
CLOTH BEACON ORANGE TIMEOUT ST (SAFETY) ×2 IMPLANT
CONTAINER PREFILL 10% NBF 15ML (MISCELLANEOUS) IMPLANT
DRSG OPSITE POSTOP 4X10 (GAUZE/BANDAGES/DRESSINGS) ×2 IMPLANT
DURAPREP 26ML APPLICATOR (WOUND CARE) ×2 IMPLANT
ELECT REM PT RETURN 9FT ADLT (ELECTROSURGICAL) ×2
ELECTRODE REM PT RTRN 9FT ADLT (ELECTROSURGICAL) ×1 IMPLANT
EXTRACTOR VACUUM KIWI (MISCELLANEOUS) IMPLANT
EXTRACTOR VACUUM M CUP 4 TUBE (SUCTIONS) IMPLANT
GLOVE BIOGEL PI IND STRL 7.0 (GLOVE) ×1 IMPLANT
GLOVE BIOGEL PI INDICATOR 7.0 (GLOVE) ×1
GLOVE ORTHO TXT STRL SZ7.5 (GLOVE) ×2 IMPLANT
GOWN STRL REUS W/TWL LRG LVL3 (GOWN DISPOSABLE) ×4 IMPLANT
KIT ABG SYR 3ML LUER SLIP (SYRINGE) IMPLANT
NEEDLE HYPO 25X5/8 SAFETYGLIDE (NEEDLE) ×2 IMPLANT
NS IRRIG 1000ML POUR BTL (IV SOLUTION) ×2 IMPLANT
PACK C SECTION WH (CUSTOM PROCEDURE TRAY) ×2 IMPLANT
PAD OB MATERNITY 4.3X12.25 (PERSONAL CARE ITEMS) ×2 IMPLANT
PENCIL SMOKE EVAC W/HOLSTER (ELECTROSURGICAL) ×2 IMPLANT
RTRCTR C-SECT PINK 25CM LRG (MISCELLANEOUS) ×2 IMPLANT
SUT CHROMIC 1 CTX 36 (SUTURE) ×4 IMPLANT
SUT PLAIN 0 NONE (SUTURE) IMPLANT
SUT PLAIN 2 0 XLH (SUTURE) IMPLANT
SUT VIC AB 0 CT1 27 (SUTURE) ×2
SUT VIC AB 0 CT1 27XBRD ANBCTR (SUTURE) ×2 IMPLANT
SUT VIC AB 2-0 CT1 (SUTURE) ×2 IMPLANT
SUT VIC AB 2-0 CT1 27 (SUTURE) ×1
SUT VIC AB 2-0 CT1 TAPERPNT 27 (SUTURE) ×1 IMPLANT
SUT VIC AB 4-0 KS 27 (SUTURE) IMPLANT
TOWEL OR 17X24 6PK STRL BLUE (TOWEL DISPOSABLE) ×2 IMPLANT
TRAY FOLEY CATH SILVER 14FR (SET/KITS/TRAYS/PACK) ×2 IMPLANT

## 2015-07-03 NOTE — Op Note (Signed)
NAMWalker Kehr:  Bills, Meyer               ACCOUNT NO.:  [id number]  MEDICAL RECORD NO.:  [id number]  LOCATION:  9305                          FACILITY:  [facility]  PHYSICIAN:  Sherron MondayJody Bovard, MD        DATE OF BIRTH:  June 02, 1985  DATE OF PROCEDURE:  07/03/2015 DATE OF DISCHARGE:                              OPERATIVE REPORT   PREOPERATIVE DIAGNOSIS:  Intrauterine pregnancy at 33 weeks, breech, preterm premature rupture of the membranes, laboring.  POSTOPERATIVE DIAGNOSIS:  Intrauterine pregnancy at 33 weeks, breech, preterm premature rupture of the membranes, laboring, delivered.  PROCEDURE:  Primary low transverse cesarean section.  SURGEON:  Sherron MondayJody Bovard, MD  ASSISTANT:  Lucretia Kerniehl John, MS3  ANESTHESIA:  Spinal.  FINDINGS:  Viable female infant at [id number] with Apgars pending at the time of dictation and a weight of 4 pounds 3 ounces.  Normal postpartum uterus, tubes, and ovaries.  IV FLUIDS:  1900 mL.  URINE OUTPUT:  500 mL clear urine at the end of the procedure.  EBL:  Approximately 400 mL.  COMPLICATIONS:  None.  SPECIMEN:  Placenta to Pathology.  PROCEDURE IN DETAIL:  After informed consent was reviewed with the patient and her husband, she was transferred to the OR where spinal anesthesia was placed and then found to be adequate.  She was prepped and draped in the normal sterile fashion and a Foley catheter was placed.  She had been placed in a supine position with a leftward tilt. At the level of approximately 2 fingerbreadths above the pubic symphysis, the Pfannenstiel incision was made, carried through the underlying layer of fascia sharply.  The fascia was incised in the midline.  The incision was extended laterally with Mayo scissors.  The superior aspect of the fascial incision was grasped with Kocher clamps, elevated, and rectus muscles were dissected off both bluntly and sharply.  Midline was easily identified and the peritoneum was entered bluntly.  This incision was  extended superiorly and inferiorly with good visualization of the bladder.  The Alexis skin retractor was placed carefully making sure that no bowel was entrapped.  The uterus was explored, confirmed that the infant was transverse.  The bladder flap was created at the vesicouterine peritoneum was somewhat superior on the uterus.  The uterus was incised in transverse fashion.  The infant was delivered from a frank breech presentation.  After waiting a minute, cord was clamped and cut.  Infant was handed off to the awaiting pediatric staff.  The placenta was expressed.  Uterus was cleared of all clot and debris.  Uterine incision was closed in 2 layers of Monocryl, the 1st of which is running locked and the 2nd an imbricating layer. The pelvic gutters were cleared of all clots and debris.  The incision was noted to be hemostatic.  The Jon Gillslexis was removed.  The peritoneum was reapproximated with 2-0 Vicryl.  The fascia was closed with a single suture of 0 Vicryl in a running fashion.  The subfascial planes were inspected, remained hemostatic with Bovie cautery.  A 3-0 plain gut was placed to close the dead space.  The skin was closed with 4-0 Vicryl on a Keith needle in subcuticular  fashion.  Benzoin and Steri-Strips were applied.  Patient tolerated the procedure well.  Sponge, lap, and needle count was correct x2 per the operating room staff.     Sherron Monday, MD     JB/MEDQ  D:  07/03/2015  T:  07/03/2015  Job:  [id number]

## 2015-07-03 NOTE — Progress Notes (Addendum)
Patient ID: Walker KehrFelicia Vecchio, female   DOB: [birth year], 30 y.o.   MRN: [id number]  +FM, cont LOF - pinkish, no VB, occ ctx.  Some pelvic pressure  AFVSS gen NAD FHTs 130-140, good var, category 1 toco rare  Abd soft, FNT  Continue current mgmt  HD #44/PPROM/breech

## 2015-07-03 NOTE — Anesthesia Preprocedure Evaluation (Addendum)
Anesthesia Evaluation  Patient identified by MRN, date of birth, ID band Patient awake    Reviewed: Allergy & Precautions, H&P , NPO status , Patient's Chart, lab work & pertinent test results  Airway Mallampati: I  TM Distance: >3 FB Neck ROM: full    Dental no notable dental hx.    Pulmonary neg pulmonary ROS,    Pulmonary exam normal        Cardiovascular negative cardio ROS Normal cardiovascular exam     Neuro/Psych negative neurological ROS  negative psych ROS   GI/Hepatic negative GI ROS, Neg liver ROS,   Endo/Other  negative endocrine ROS  Renal/GU negative Renal ROS     Musculoskeletal   Abdominal Normal abdominal exam  (+)   Peds  Hematology negative hematology ROS (+)   Anesthesia Other Findings   Reproductive/Obstetrics (+) Pregnancy                             Anesthesia Physical Anesthesia Plan  ASA: II  Anesthesia Plan: Spinal   Post-op Pain Management:    Induction:   Airway Management Planned:   Additional Equipment:   Intra-op Plan:   Post-operative Plan:   Informed Consent: I have reviewed the patients History and Physical, chart, labs and discussed the procedure including the risks, benefits and alternatives for the proposed anesthesia with the patient or authorized representative who has indicated his/her understanding and acceptance.     Plan Discussed with: CRNA and Surgeon  Anesthesia Plan Comments:         Anesthesia Quick Evaluation  

## 2015-07-03 NOTE — Progress Notes (Addendum)
Patient ID: Yolanda KehrFelicia Berg, female   DOB: [birth year], 30 y.o.   MRN: [id number]  Pt with painful ctx for last 1-2 hours.  Magnesium declined by patient. States getting worse now wrapping around to back.      AFVSS gen NAD FHTs 140's-150's, had 4-5 minute prolonged, good variability, category 2 toco q 4min Abdomen tender  D/w pt r/b/a of LTCS, including bleeding, infection, damage to surrounding organs, injury to infant, difficulty with incisional healing.  SVE 1 cm,   Working to get IV access.  Was getting bolus and IV infiltrated.    BS US  Transverse lie.    Will proceed

## 2015-07-03 NOTE — Anesthesia Postprocedure Evaluation (Signed)
Anesthesia Post Note  Patient: Yolanda KehrFelicia Berg  Procedure(s) Performed: Procedure(s) (LRB): CESAREAN SECTION (N/A)  Patient location during evaluation: PACU Anesthesia Type: Spinal Level of consciousness: awake Pain management: pain level controlled Vital Signs Assessment: post-procedure vital signs reviewed and stable Respiratory status: spontaneous breathing Cardiovascular status: stable Postop Assessment: no headache, no backache, spinal receding and no signs of nausea or vomiting Anesthetic complications: no    Last Vitals:  Filed Vitals:   07/03/15 1651 07/03/15 1658  BP: 99/64 97/61  Pulse: 68 65  Temp: 36.4 C   Resp: 18 16    Last Pain:  Filed Vitals:   07/03/15 1705  PainSc: 0-No pain                 Kirke Breach JR,JOHN Mahati Vajda

## 2015-07-03 NOTE — Anesthesia Procedure Notes (Signed)
Spinal Patient location during procedure: OR Start time: 07/03/2015 3:43 PM End time: 07/03/2015 3:46 PM Staffing Anesthesiologist: Leilani AbleHATCHETT, Machele Deihl Performed by: anesthesiologist  Preanesthetic Checklist Completed: patient identified, surgical consent, pre-op evaluation, timeout performed, IV checked, risks and benefits discussed and monitors and equipment checked Spinal Block Patient position: sitting Prep: site prepped and draped and DuraPrep Patient monitoring: heart rate, cardiac monitor, continuous pulse ox and blood pressure Approach: midline Location: L3-4 Injection technique: single-shot Needle Needle type: Sprotte  Needle gauge: 24 G Needle length: 9 cm Needle insertion depth: 5 cm Assessment Sensory level: T6

## 2015-07-03 NOTE — Progress Notes (Signed)
Patient c/o painful contractions.

## 2015-07-03 NOTE — Progress Notes (Signed)
Patient returned to floor after visiting with family.  She reports a large gush of pink tinged fluid with 2 painful contractions.  She also reports seeing some blood tinged mucous.  She was put on the monitor and vitals taken.

## 2015-07-03 NOTE — Transfer of Care (Signed)
Immediate Anesthesia Transfer of Care Note  Patient: Yolanda KehrFelicia Reller  Procedure(s) Performed: Procedure(s): CESAREAN SECTION (N/A)  Patient Location: PACU  Anesthesia Type:Spinal  Level of Consciousness: awake and alert   Airway & Oxygen Therapy: Patient Spontanous Breathing  Post-op Assessment: Report given to RN and Post -op Vital signs reviewed and stable  Post vital signs: Reviewed  Last Vitals:  Filed Vitals:   07/03/15 1404 07/03/15 1407  BP:    Pulse: 73 77  Temp:    Resp:      Complications: No apparent anesthesia complications

## 2015-07-03 NOTE — Brief Op Note (Signed)
07/03/2015  5:21 PM  PATIENT:  Yolanda Berg  30 y.o. female  PRE-OPERATIVE DIAGNOSIS:  PPROM, breech, laboring - IUP at 33wk  POST-OPERATIVE DIAGNOSIS:  PPROM, breech, laboring - IUP at 33 wk  PROCEDURE:  Procedure(s): CESAREAN SECTION (N/A)  SURGEON:  Surgeon(s) and Role:       * Sherian ReinJody Bovard-Stuckert, MD  ASSISTANTS: Lucretia Kerniehl, John MS3   ANESTHESIA:   spinal  FINDINGS: viable female infant at 16:05, apgars P, wt = 4#3.  Nl postpartum uterus, tubes and ovaries  EBL:  Total I/O In: 1900 [I.V.:1900] Out: 800 [Urine:400; Blood:400]  BLOOD ADMINISTERED:none  DRAINS: Urinary Catheter (Foley)   LOCAL MEDICATIONS USED:  NONE  SPECIMEN:  Source of Specimen:  Placenta  DISPOSITION OF SPECIMEN:  PATHOLOGY  COUNTS:  YES  TOURNIQUET:  * No tourniquets in log *  DICTATION: .Other Dictation: Dictation Number [id number]  PLAN OF CARE: Admit to inpatient   PATIENT DISPOSITION:  PACU - hemodynamically stable.   Delay start of Pharmacological VTE agent (>24hrs) due to surgical blood loss or risk of bleeding: not applicable

## 2015-07-04 ENCOUNTER — Encounter (HOSPITAL_COMMUNITY): Payer: Self-pay | Admitting: Obstetrics and Gynecology

## 2015-07-04 LAB — CBC
HCT: 31.4 % — ABNORMAL LOW (ref 36.0–46.0)
Hemoglobin: 10.8 g/dL — ABNORMAL LOW (ref 12.0–15.0)
MCH: 29.2 pg (ref 26.0–34.0)
MCHC: 34.4 g/dL (ref 30.0–36.0)
MCV: 84.9 fL (ref 78.0–100.0)
PLATELETS: 151 10*3/uL (ref 150–400)
RBC: 3.7 MIL/uL — AB (ref 3.87–5.11)
RDW: 13.2 % (ref 11.5–15.5)
WBC: 10 10*3/uL (ref 4.0–10.5)

## 2015-07-04 LAB — RPR: RPR Ser Ql: NONREACTIVE

## 2015-07-04 MED ORDER — OXYCODONE-ACETAMINOPHEN 5-325 MG PO TABS: 1.0000 | ORAL_TABLET | Freq: Four times a day (QID) | ORAL | Status: DC | PRN

## 2015-07-04 MED ORDER — IBUPROFEN 800 MG PO TABS: 800.0000 mg | ORAL_TABLET | Freq: Three times a day (TID) | ORAL | Status: DC

## 2015-07-04 MED ORDER — PRENATAL MULTIVITAMIN CH: 1.0000 | ORAL_TABLET | Freq: Every day | ORAL | Status: DC

## 2015-07-04 NOTE — Progress Notes (Signed)
Subjective: Postpartum Day 1: Cesarean Delivery Patient reports incisional pain, tolerating PO and no problems voiding.  Nl lochia, pain controlled.    Objective: Vital signs in last 24 hours: Temp:  [97.4 F (36.3 C)-98.7 F (37.1 C)] 98.6 F (37 C) (03/27 0536) Pulse Rate:  [61-82] 65 (03/27 0536) Resp:  [15-20] 20 (03/27 0536) BP: (95-112)/(50-71) 100/50 mmHg (03/27 0536) SpO2:  [96 %-100 %] 98 % (03/27 0536)  Physical Exam:  General: alert and no distress Lochia: appropriate Uterine Fundus: firm Incision: healing well DVT Evaluation: No evidence of DVT seen on physical exam.   Recent Labs  07/02/15 0910 07/04/15 0514  HGB 12.1 10.8*  HCT 35.7* 31.4*    Assessment/Plan: Status post Cesarean section. Doing well postoperatively.  Continue current care.  Bovard-Stuckert, Jamone Garrido 07/04/2015, 7:37 AM

## 2015-07-04 NOTE — Discharge Summary (Signed)
OB Discharge Summary     Patient Name: Yolanda KehrFelicia Berg DOB: 1986/02/04 MRN: [id number]  Date of admission: 05/21/2015 Delivering MD: Sherian ReinBOVARD-STUCKERT, Bates [place]   Date of discharge: 07/05/2015  Admitting diagnosis: 27wks, a lot of watery discharge and slight cramping PPROM, breech Intrauterine pregnancy: 8915w1d     Secondary diagnosis:  Principal Problem:   S/P cesarean section Active Problems:   Preterm PROM with onset of labor more than 24 hours following rupture laboring Additional problems: breech presentation     Discharge diagnosis: Preterm Pregnancy Delivered                                                                                                Post partum procedures:none  Augmentation: N/A  Complications: None  Hospital course:  Onset of Labor With Unplanned C/S  30 y.o. yo [id number] at 5415w1d was admitted with PPROM on 05/21/2015.   Received 2 courses of BMZ and antibiotics for latency. Magnesium for CP prophylaxis.  At 33 weeks labored, breech confirmed by BS US.   Membrane Rupture Time/Date: 5:30 AM ,05/21/2015   The patient went for cesarean section due to Malpresentation, and delivered a Viable infant,07/03/2015  Details of operation can be found in separate operative note. Patient had an uncomplicated postpartum course.  She is ambulating,tolerating a regular diet, passing flatus, and urinating well.  Patient is discharged home in stable condition 07/05/2015.  Physical exam  Filed Vitals:   07/04/15 0536 07/04/15 1125 07/04/15 1400 07/04/15 2127  BP: 100/50 93/62 97/61  118/69  Pulse: 65 71 70 65  Temp: 98.6 F (37 C) 98 F (36.7 C) 97.9 F (36.6 C) 97.7 F (36.5 C)  TempSrc: Oral Oral Oral Oral  Resp: 20 16 12 16   Height:      Weight:      SpO2: 98% 99%  100%   General: alert and no distress Lochia: appropriate Uterine Fundus: firm Incision: Healing well with no significant drainage DVT Evaluation: No evidence of DVT seen on physical exam. Labs: Lab  Results  Component Value Date   WBC 10.0 07/04/2015   HGB 10.8* 07/04/2015   HCT 31.4* 07/04/2015   MCV 84.9 07/04/2015   PLT 151 07/04/2015   CMP Latest Ref Rng 04/19/2013  Glucose 70 - 99 mg/dL 147(W102(H)  BUN 6 - 23 mg/dL 12  Creatinine 2.950.50 - 6.211.10 mg/dL 3.080.54  Sodium 657137 - 846147 mEq/L 134(L)  Potassium 3.7 - 5.3 mEq/L 4.0  Chloride 96 - 112 mEq/L 95(L)  CO2 19 - 32 mEq/L 23  Calcium 8.4 - 10.5 mg/dL 9.6  Total Protein 6.0 - 8.3 g/dL -  Total Bilirubin 0.3 - 1.2 mg/dL -  Alkaline Phos 39 - 962117 U/L -  AST 0 - 37 U/L -  ALT 0 - 35 U/L -    Discharge instruction: per After Visit Summary and "Baby and Me Booklet".  After visit meds:    Medication List    TAKE these medications        calcium carbonate 500 MG chewable tablet  Commonly known as:  TUMS - dosed in mg elemental calcium  Chew 1 tablet by mouth as needed for indigestion or heartburn.     ibuprofen 800 MG tablet  Commonly known as:  ADVIL,MOTRIN  Take 1 tablet (800 mg total) by mouth every 8 (eight) hours.     multivitamin with minerals Tabs tablet  Take 1 tablet by mouth daily.     oxyCODONE-acetaminophen 5-325 MG tablet  Commonly known as:  PERCOCET/ROXICET  Take 1-2 tablets by mouth every 6 (six) hours as needed for severe pain (moderate - severe pain).     prenatal multivitamin Tabs tablet  Take 1 tablet by mouth daily at 12 noon.        Diet: routine diet  Activity: Advance as tolerated. Pelvic rest for 6 weeks.   Outpatient follow up:2 weeks Follow up Appt:No future appointments. Follow up Visit:No Follow-up on file.  Postpartum contraception: Undecided  Newborn Data: Live born female  Birth Weight: 4 lb 3 oz (1900 g) APGAR: 7, 8  Baby Feeding: Breast Disposition:NICU   07/04/2015 Sherian Rein, MD

## 2015-07-04 NOTE — Addendum Note (Signed)
Addendum  created 07/04/15 0831 by Elgie CongoNataliya H Kaye Luoma, CRNA   Modules edited: Clinical Notes   Clinical Notes:  File: [id number]

## 2015-07-04 NOTE — Lactation Note (Signed)
This note was copied from a baby's chart. Lactation Consultation Note  Initial visit made.  Mom has Providing Breastmilk For Your Baby in NICU booklet.  She is pumping every 3 hours and obtaining drops.  Mom breastfed her first baby now 30 years old.  Discussed colostrum and milk coming to volume.  Mom has a pump at home from first baby but will call insurance company for a new pump.  2 week rental also discussed.  Encouraged to call with questions prn.  Patient Name: Yolanda Berg Date: 07/04/2015 Reason for consult: Initial assessment;NICU baby   Maternal Data    Feeding Feeding Type: Formula Length of feed: 20 min  LATCH Score/Interventions                      Lactation Tools Discussed/Used Pump Review: Setup, frequency, and cleaning;Milk Storage Initiated by:: RN Date initiated:: 07/03/15   Consult Status Consult Status: Follow-up Date: 07/05/15 Follow-up type: In-patient    Huston FoleyMOULDEN, Oluwatomisin Deman S 07/04/2015, 3:32 PM

## 2015-07-04 NOTE — Anesthesia Postprocedure Evaluation (Signed)
Anesthesia Post Note  Patient: Yolanda Berg  Procedure(s) Performed: Procedure(s) (LRB): CESAREAN SECTION (N/A)  Patient location during evaluation: [facility] Anesthesia Type: Spinal Level of consciousness: awake, awake and alert and oriented Pain management: pain level controlled Vital Signs Assessment: post-procedure vital signs reviewed and stable Respiratory status: spontaneous breathing Cardiovascular status: stable Postop Assessment: no headache, no backache, patient able to bend at knees, no signs of nausea or vomiting and adequate PO intake Anesthetic complications: no    Last Vitals:  Filed Vitals:   07/04/15 0154 07/04/15 0536  BP: 100/53 100/50  Pulse: 64 65  Temp: 37.1 C 37 C  Resp: 18 20    Last Pain:  Filed Vitals:   07/04/15 0608  PainSc: 2                  [name]

## 2015-07-05 ENCOUNTER — Ambulatory Visit: Payer: Self-pay

## 2015-07-05 NOTE — Lactation Note (Signed)
This note was copied from a baby's chart. Lactation Consultation Note   With this mom of a NICU baby, now 18 hours old, and 33 3/7 weeks CGA, . Mom has been able to pump up to 15 ml's at a time of colostrum. I showed mom how to hand express, and advised her to add HE to each pumping, to increase her milk supply and the fat content in her milk.  1800 - I met up with parents and baby in NICU, rented a DEP to mom for 2 weeks, and instructed her in it's sue. At this time, the baby was being fed via ng, and was vigorously sucking on a pacifier. Mom was asked if she wanted to nuzzle, and she said yes. The baby was fussy at the breast, I think being used to a paciifer, so i fitted mom with a 20 nipple shield, and she suckled intermittently for about 5-10 minutes, and then got tired.  I noted that mom had circular scabs on her areola. She was using 30 flanges, much too large for her. I gave mom both 21 and 24  Flanges to try, and instructed her in how they should fit. I also advised her to apply coconut oil inside the flanges at point of contact, to decrease friction. Mom knows to pump 15-30 minutes, until she stops dripping.   Patient Name: Yolanda Berg [id number] Date: 07/05/2015     Maternal Data    Feeding    LATCH Score/Interventions                      Lactation Tools Discussed/Used     Consult Status      Tonna Corner 07/05/2015, 6:00 PM

## 2015-07-05 NOTE — Progress Notes (Signed)
Pt discharged to home with husband.  Condition stable.  Pt ambulated to car with R. Guy, SN[facility].  No equipment for home ordered at discharge. 

## 2015-07-05 NOTE — Progress Notes (Signed)
Subjective: Postpartum Day 2: Cesarean Delivery Patient reports incisional pain, tolerating PO and no problems voiding.    Objective: Vital signs in last 24 hours: Temp:  [97.7 F (36.5 C)-98 F (36.7 C)] 97.7 F (36.5 C) (03/28 0605) Pulse Rate:  [65-76] 76 (03/28 0605) Resp:  [12-16] 16 (03/28 0605) BP: (93-118)/(56-69) 106/56 mmHg (03/28 0605) SpO2:  [99 %-100 %] 99 % (03/28 16100605)  Physical Exam:  General: alert and no distress Lochia: appropriate Uterine Fundus: firm Incision: healing well DVT Evaluation: No evidence of DVT seen on physical exam.   Recent Labs  07/02/15 0910 07/04/15 0514  HGB 12.1 10.8*  HCT 35.7* 31.4*    Assessment/Plan: Status post Cesarean section. Doing well postoperatively.  Discharge home with standard precautions and return to clinic in 2 weeks for incision check then 6 weeks for Postpartum care.  D/C with motrin, percocet and PNV  Bovard-Stuckert, Hermilo Dutter 07/05/2015, 6:45 AM

## 2015-07-06 ENCOUNTER — Ambulatory Visit: Payer: Self-pay

## 2015-07-06 NOTE — Lactation Note (Signed)
This note was copied from a baby's chart. Lactation Consultation Note  Patient Name: Yolanda Berg ZOXWR'UToday's Date: 07/06/2015 Reason for consult: Follow-up assessment;NICU baby  NICU baby 6666 hours old. Called to NICU to assist mom with breast engorgement. Enc mom to ice breasts 10-15 minutes at a time. Discussed hands-free pumping in order to massage breasts while pumping. Enc mom to inspect breasts for any hard/tight areas after pumping in order to massage and hand express. Enc mom to use #24 flanges for now along with coconut oil, and discussed possible need for #21 flanges after her breast swelling subsides. Enc mom to pump a couple of minutes past last letdown.  Mom reports that baby was at breast yesterday. Discussed how to use NS and then transition away from its use. Enc offering lots of STS and attempts at breast. Mom aware of OP/BFSG and LC phone line assistance after D/C.  Maternal Data Has patient been taught Hand Expression?: Yes Does the patient have breastfeeding experience prior to this delivery?: Yes  Feeding Feeding Type: Breast Milk Length of feed: 30 min  LATCH Score/Interventions                      Lactation Tools Discussed/Used     Consult Status Consult Status: PRN    Geralynn OchsWILLIARD, Bryne Lindon 07/06/2015, 10:40 AM

## 2015-08-15 DIAGNOSIS — Z3009 Encounter for other general counseling and advice on contraception: Secondary | ICD-10-CM | POA: Diagnosis not present

## 2015-08-15 DIAGNOSIS — Z130 Encounter for screening for diseases of the blood and blood-forming organs and certain disorders involving the immune mechanism: Secondary | ICD-10-CM | POA: Diagnosis not present

## 2015-08-15 DIAGNOSIS — Z1389 Encounter for screening for other disorder: Secondary | ICD-10-CM | POA: Diagnosis not present

## 2016-04-07 DIAGNOSIS — J019 Acute sinusitis, unspecified: Secondary | ICD-10-CM | POA: Diagnosis not present

## 2016-05-02 ENCOUNTER — Encounter (HOSPITAL_BASED_OUTPATIENT_CLINIC_OR_DEPARTMENT_OTHER): Payer: Self-pay

## 2016-05-02 DIAGNOSIS — B349 Viral infection, unspecified: Secondary | ICD-10-CM | POA: Diagnosis not present

## 2016-05-02 DIAGNOSIS — R102 Pelvic and perineal pain: Secondary | ICD-10-CM | POA: Diagnosis present

## 2016-05-02 LAB — URINALYSIS, ROUTINE W REFLEX MICROSCOPIC
GLUCOSE, UA: NEGATIVE mg/dL
Hgb urine dipstick: NEGATIVE
Ketones, ur: >80 mg/dL — AB
LEUKOCYTES UA: NEGATIVE
Nitrite: NEGATIVE
Protein, ur: NEGATIVE mg/dL
SPECIFIC GRAVITY, URINE: 1.025 (ref 1.005–1.030)
pH: 6 (ref 5.0–8.0)

## 2016-05-02 LAB — PREGNANCY, URINE: Preg Test, Ur: NEGATIVE

## 2016-05-02 NOTE — ED Triage Notes (Signed)
C/o right flank pain, urinary freq and vaginal d/c-NAD-presents to triage in w/c

## 2016-05-03 ENCOUNTER — Emergency Department (HOSPITAL_BASED_OUTPATIENT_CLINIC_OR_DEPARTMENT_OTHER)
Admission: EM | Admit: 2016-05-03 | Discharge: 2016-05-03 | Disposition: A | Discharge status: Home / Self Care | Payer: BLUE CROSS/BLUE SHIELD | Attending: Emergency Medicine | Admitting: Emergency Medicine

## 2016-05-03 DIAGNOSIS — B349 Viral infection, unspecified: Secondary | ICD-10-CM

## 2016-05-03 LAB — CBC WITH DIFFERENTIAL/PLATELET
BASOS ABS: 0 10*3/uL (ref 0.0–0.1)
BASOS PCT: 0 %
EOS ABS: 0 10*3/uL (ref 0.0–0.7)
Eosinophils Relative: 1 %
HCT: 38.8 % (ref 36.0–46.0)
HEMOGLOBIN: 12.9 g/dL (ref 12.0–15.0)
Lymphocytes Relative: 6 %
Lymphs Abs: 0.4 10*3/uL — ABNORMAL LOW (ref 0.7–4.0)
MCH: 28.5 pg (ref 26.0–34.0)
MCHC: 33.2 g/dL (ref 30.0–36.0)
MCV: 85.8 fL (ref 78.0–100.0)
MONO ABS: 0.5 10*3/uL (ref 0.1–1.0)
Monocytes Relative: 8 %
NEUTROS PCT: 85 %
Neutro Abs: 5.2 10*3/uL (ref 1.7–7.7)
Platelets: 135 10*3/uL — ABNORMAL LOW (ref 150–400)
RBC: 4.52 MIL/uL (ref 3.87–5.11)
RDW: 13.1 % (ref 11.5–15.5)
WBC: 6.1 10*3/uL (ref 4.0–10.5)

## 2016-05-03 LAB — COMPREHENSIVE METABOLIC PANEL
ALBUMIN: 4.3 g/dL (ref 3.5–5.0)
ALT: 14 U/L (ref 14–54)
ANION GAP: 9 (ref 5–15)
AST: 21 U/L (ref 15–41)
Alkaline Phosphatase: 43 U/L (ref 38–126)
BUN: 14 mg/dL (ref 6–20)
CO2: 25 mmol/L (ref 22–32)
Calcium: 8.5 mg/dL — ABNORMAL LOW (ref 8.9–10.3)
Chloride: 103 mmol/L (ref 101–111)
Creatinine, Ser: 0.51 mg/dL (ref 0.44–1.00)
GFR calc Af Amer: >60 mL/min (ref >60)
GFR calc non Af Amer: >60 mL/min (ref >60)
GLUCOSE: 110 mg/dL — AB (ref 65–99)
POTASSIUM: 4 mmol/L (ref 3.5–5.1)
SODIUM: 137 mmol/L (ref 135–145)
Total Bilirubin: 0.9 mg/dL (ref 0.3–1.2)
Total Protein: 7.4 g/dL (ref 6.5–8.1)

## 2016-05-03 MED ORDER — ONDANSETRON HCL 4 MG/2ML IJ SOLN
4.0000 mg | Freq: Once | INTRAMUSCULAR | Status: AC
  Administered 2016-05-03: 4 mg via INTRAVENOUS

## 2016-05-03 MED ORDER — ONDANSETRON 8 MG PO TBDP: 8.0000 mg | ORAL_TABLET | Freq: Three times a day (TID) | ORAL | 0 refills | Status: AC | PRN

## 2016-05-03 MED ORDER — ONDANSETRON HCL 4 MG/2ML IJ SOLN
INTRAMUSCULAR | Status: AC
  Filled 2016-05-03: qty 2

## 2016-05-03 MED ORDER — KETOROLAC TROMETHAMINE 15 MG/ML IJ SOLN
15.0000 mg | Freq: Once | INTRAMUSCULAR | Status: AC
  Administered 2016-05-03: 15 mg via INTRAVENOUS
  Filled 2016-05-03: qty 1

## 2016-05-03 NOTE — ED Notes (Signed)
Pt states she began having pain at rt and lt flank areas, now having body aches. States she noted her urine being cloudy and having discharge

## 2016-05-03 NOTE — ED Provider Notes (Signed)
[facility] Provider Note: Lowella Dell, MD, FACEP  CSN: [id number] MRN: [id number] ARRIVAL: 05/02/16 at 2237 ROOM: [facility]04/[facility]04   CHIEF COMPLAINT  Flank Pain   HISTORY OF PRESENT ILLNESS  Yolanda Berg is a 31 y.o. female who had severe, intermittent pelvic pain 2 days ago that resolved yesterday. Pain is worse with movement and was associated with a vaginal discharge which is also improved. She is now here with flulike symptoms, complaining of general malaise and diffuse abdominal pain, nausea, vomiting, diarrhea and pain in her flanks bilaterally. Pain is moderate and worse with movement. She was given IV fluids and Zofran with improvement in her nausea. She was not aware of being febrile but was 100.1 on my exam.   Past Medical History:  Diagnosis Date  . Celiac disease   . S/P cesarean section 07/03/2015  . Shingles   . Staph infection    pt denies    Past Surgical History:  Procedure Laterality Date  . CESAREAN SECTION N/A 07/03/2015   Procedure: CESAREAN SECTION;  Surgeon: Lavina Hamman, MD;  Location: [facility];  Service: Obstetrics;  Laterality: N/A;  . HYSTEROSCOPY W/D&C N/A 03/09/2014   Procedure: DILATATION AND CURETTAGE /HYSTEROSCOPY;  Surgeon: Lavina Hamman, MD;  Location: [facility];  Service: Gynecology;  Laterality: N/A;  . PILONIDAL CYST / SINUS EXCISION    . TOE SURGERY    . UPPER GI ENDOSCOPY    . WISDOM TOOTH EXTRACTION      No family history on file.  Social History  Substance Use Topics  . Smoking status: Never Smoker  . Smokeless tobacco: Never Used  . Alcohol use No    Prior to Admission medications   Medication Sig Start Date End Date Taking? Authorizing Provider  ondansetron (ZOFRAN ODT) 8 MG disintegrating tablet Take 1 tablet (8 mg total) by mouth every 8 (eight) hours as needed for nausea or vomiting. 05/03/16   Paula Libra, MD    Allergies Patient has no known allergies.   REVIEW OF SYSTEMS  Negative except as noted here or in  the History of Present Illness.   PHYSICAL EXAMINATION  Initial Vital Signs Blood pressure 108/66, pulse 89, temperature 98 F (36.7 C), temperature source Oral, resp. rate 16, height 5\' 9"  (1.753 m), weight 160 lb (72.6 kg), last menstrual period 04/23/2016, SpO2 99 %, unknown if currently breastfeeding.  Examination General: Well-developed, well-nourished female in no acute distress; appearance consistent with age of record HENT: normocephalic; atraumatic Eyes: pupils equal, round and reactive to light; extraocular muscles intact Neck: supple Heart: regular rate and rhythm Lungs: clear to auscultation bilaterally Abdomen: soft; nondistended; mild diffuse tenderness; no masses or hepatosplenomegaly; bowel sounds present GU: Mild bilateral CVA tenderness Extremities: No deformity; full range of motion; pulses normal Neurologic: Awake, alert and oriented; motor function intact in all extremities and symmetric; no facial droop Skin: Warm and dry Psychiatric: Flat affect   RESULTS  Summary of this visit's results, reviewed by myself:   EKG Interpretation  Date/Time:    Ventricular Rate:    PR Interval:    QRS Duration:   QT Interval:    QTC Calculation:   R Axis:     Text Interpretation:        Laboratory Studies: Results for orders placed or performed during the hospital encounter of 05/03/16 (from the past 24 hour(s))  Pregnancy, urine     Status: None   Collection Time: 05/02/16 10:44 PM  Result Value Ref Range  Preg Test, Ur NEGATIVE NEGATIVE  Urinalysis, Routine w reflex microscopic     Status: Abnormal   Collection Time: 05/02/16 10:44 PM  Result Value Ref Range   Color, Urine YELLOW YELLOW   APPearance CLEAR CLEAR   Specific Gravity, Urine 1.025 1.005 - 1.030   pH 6.0 5.0 - 8.0   Glucose, UA NEGATIVE NEGATIVE mg/dL   Hgb urine dipstick NEGATIVE NEGATIVE   Bilirubin Urine SMALL (A) NEGATIVE   Ketones, ur >80 (A) NEGATIVE mg/dL   Protein, ur NEGATIVE  NEGATIVE mg/dL   Nitrite NEGATIVE NEGATIVE   Leukocytes, UA NEGATIVE NEGATIVE  CBC with Differential     Status: Abnormal   Collection Time: 05/03/16 12:40 AM  Result Value Ref Range   WBC 6.1 4.0 - 10.5 K/uL   RBC 4.52 3.87 - 5.11 MIL/uL   Hemoglobin 12.9 12.0 - 15.0 g/dL   HCT 29.538.8 62.136.0 - 30.846.0 %   MCV 85.8 78.0 - 100.0 fL   MCH 28.5 26.0 - 34.0 pg   MCHC 33.2 30.0 - 36.0 g/dL   RDW 65.713.1 84.611.5 - 96.215.5 %   Platelets 135 (L) 150 - 400 K/uL   Neutrophils Relative % 85 %   Neutro Abs 5.2 1.7 - 7.7 K/uL   Lymphocytes Relative 6 %   Lymphs Abs 0.4 (L) 0.7 - 4.0 K/uL   Monocytes Relative 8 %   Monocytes Absolute 0.5 0.1 - 1.0 K/uL   Eosinophils Relative 1 %   Eosinophils Absolute 0.0 0.0 - 0.7 K/uL   Basophils Relative 0 %   Basophils Absolute 0.0 0.0 - 0.1 K/uL  Comprehensive metabolic panel     Status: Abnormal   Collection Time: 05/03/16 12:40 AM  Result Value Ref Range   Sodium 137 135 - 145 mmol/L   Potassium 4.0 3.5 - 5.1 mmol/L   Chloride 103 101 - 111 mmol/L   CO2 25 22 - 32 mmol/L   Glucose, Bld 110 (H) 65 - 99 mg/dL   BUN 14 6 - 20 mg/dL   Creatinine, Ser 9.520.51 0.44 - 1.00 mg/dL   Calcium 8.5 (L) 8.9 - 10.3 mg/dL   Total Protein 7.4 6.5 - 8.1 g/dL   Albumin 4.3 3.5 - 5.0 g/dL   AST 21 15 - 41 U/L   ALT 14 14 - 54 U/L   Alkaline Phosphatase 43 38 - 126 U/L   Total Bilirubin 0.9 0.3 - 1.2 mg/dL   GFR calc non Af Amer >60 >60 mL/min   GFR calc Af Amer >60 >60 mL/min   Anion gap 9 5 - 15   Imaging Studies: No results found.  ED COURSE  Nursing notes and initial vitals signs, including pulse oximetry, reviewed.  Vitals:   05/02/16 2246 05/02/16 2247  BP: 108/66   Pulse: 89   Resp: 16   Temp: 98 F (36.7 C)   TempSrc: Oral   SpO2: 99%   Weight:  160 lb (72.6 kg)  Height:  5\' 9"  (1.753 m)   2:54 AM Patient feeling better after IV Toradol. Symptoms and labs consistent with a viral syndrome. We will treat symptomatically. She was advised to take NSAIDs as  tolerated. We will provide Zofran for nausea. Diarrhea may be treated with over-the-counter drugs.  PROCEDURES    ED DIAGNOSES     ICD-9-CM ICD-10-CM   1. Viral illness 079.99 B34.9        Paula LibraJohn Mabrey Howland, MD 05/03/16 [phone]

## 2016-05-03 NOTE — ED Notes (Signed)
States appetite poor, states has been very nauseated. Last meal was this am.

## 2016-05-04 DIAGNOSIS — R1084 Generalized abdominal pain: Secondary | ICD-10-CM | POA: Diagnosis not present

## 2016-07-19 DIAGNOSIS — Z1389 Encounter for screening for other disorder: Secondary | ICD-10-CM | POA: Diagnosis not present

## 2016-07-19 DIAGNOSIS — Z01419 Encounter for gynecological examination (general) (routine) without abnormal findings: Secondary | ICD-10-CM | POA: Diagnosis not present

## 2016-07-19 DIAGNOSIS — Z6823 Body mass index (BMI) 23.0-23.9, adult: Secondary | ICD-10-CM | POA: Diagnosis not present

## 2017-04-16 DIAGNOSIS — G43909 Migraine, unspecified, not intractable, without status migrainosus: Secondary | ICD-10-CM | POA: Diagnosis not present

## 2017-09-24 DIAGNOSIS — D485 Neoplasm of uncertain behavior of skin: Secondary | ICD-10-CM | POA: Diagnosis not present

## 2018-03-04 DIAGNOSIS — M2242 Chondromalacia patellae, left knee: Secondary | ICD-10-CM | POA: Diagnosis not present

## 2018-03-11 DIAGNOSIS — M2242 Chondromalacia patellae, left knee: Secondary | ICD-10-CM | POA: Diagnosis not present

## 2018-03-11 DIAGNOSIS — M25562 Pain in left knee: Secondary | ICD-10-CM | POA: Diagnosis not present

## 2018-03-11 DIAGNOSIS — R262 Difficulty in walking, not elsewhere classified: Secondary | ICD-10-CM | POA: Diagnosis not present

## 2018-03-12 DIAGNOSIS — R61 Generalized hyperhidrosis: Secondary | ICD-10-CM | POA: Diagnosis not present

## 2018-03-12 DIAGNOSIS — Z124 Encounter for screening for malignant neoplasm of cervix: Secondary | ICD-10-CM | POA: Diagnosis not present

## 2018-03-12 DIAGNOSIS — Z0000 Encounter for general adult medical examination without abnormal findings: Secondary | ICD-10-CM | POA: Diagnosis not present

## 2018-03-12 DIAGNOSIS — Z1151 Encounter for screening for human papillomavirus (HPV): Secondary | ICD-10-CM | POA: Diagnosis not present

## 2018-03-12 DIAGNOSIS — Z01419 Encounter for gynecological examination (general) (routine) without abnormal findings: Secondary | ICD-10-CM | POA: Diagnosis not present

## 2018-03-12 DIAGNOSIS — Z1389 Encounter for screening for other disorder: Secondary | ICD-10-CM | POA: Diagnosis not present

## 2018-03-12 DIAGNOSIS — Z6822 Body mass index (BMI) 22.0-22.9, adult: Secondary | ICD-10-CM | POA: Diagnosis not present

## 2018-03-19 DIAGNOSIS — M2242 Chondromalacia patellae, left knee: Secondary | ICD-10-CM | POA: Diagnosis not present

## 2018-03-19 DIAGNOSIS — R262 Difficulty in walking, not elsewhere classified: Secondary | ICD-10-CM | POA: Diagnosis not present

## 2018-03-19 DIAGNOSIS — M25562 Pain in left knee: Secondary | ICD-10-CM | POA: Diagnosis not present

## 2019-07-22 ENCOUNTER — Other Ambulatory Visit: Payer: Self-pay | Admitting: Gynecology

## 2019-07-22 DIAGNOSIS — N644 Mastodynia: Secondary | ICD-10-CM

## 2019-07-27 ENCOUNTER — Other Ambulatory Visit: Payer: Self-pay

## 2019-07-27 ENCOUNTER — Ambulatory Visit
Admission: RE | Admit: 2019-07-27 | Discharge: 2019-07-27 | Disposition: A | Discharge status: Home / Self Care | Payer: BLUE CROSS/BLUE SHIELD | Source: Ambulatory Visit | Attending: Gynecology | Admitting: Gynecology

## 2019-07-27 ENCOUNTER — Ambulatory Visit
Admission: RE | Admit: 2019-07-27 | Discharge: 2019-07-27 | Disposition: A | Discharge status: Home / Self Care | Payer: Self-pay | Source: Ambulatory Visit | Attending: Gynecology | Admitting: Gynecology

## 2019-07-27 DIAGNOSIS — N644 Mastodynia: Secondary | ICD-10-CM

## 2019-07-27 IMAGING — MG DIGITAL DIAGNOSTIC BILAT W/ TOMO W/ CAD
6 of 10 series · 6 of 30 positions shown · non-contrast
Comparison: None.

CLINICAL DATA: 34-year-old female with focal left breast pain and
palpable thickening for approximately 6 weeks.

EXAM:
DIGITAL DIAGNOSTIC BILATERAL MAMMOGRAM WITH CAD AND TOMO
ULTRASOUND LEFT BREAST

[R CC synth-2D]
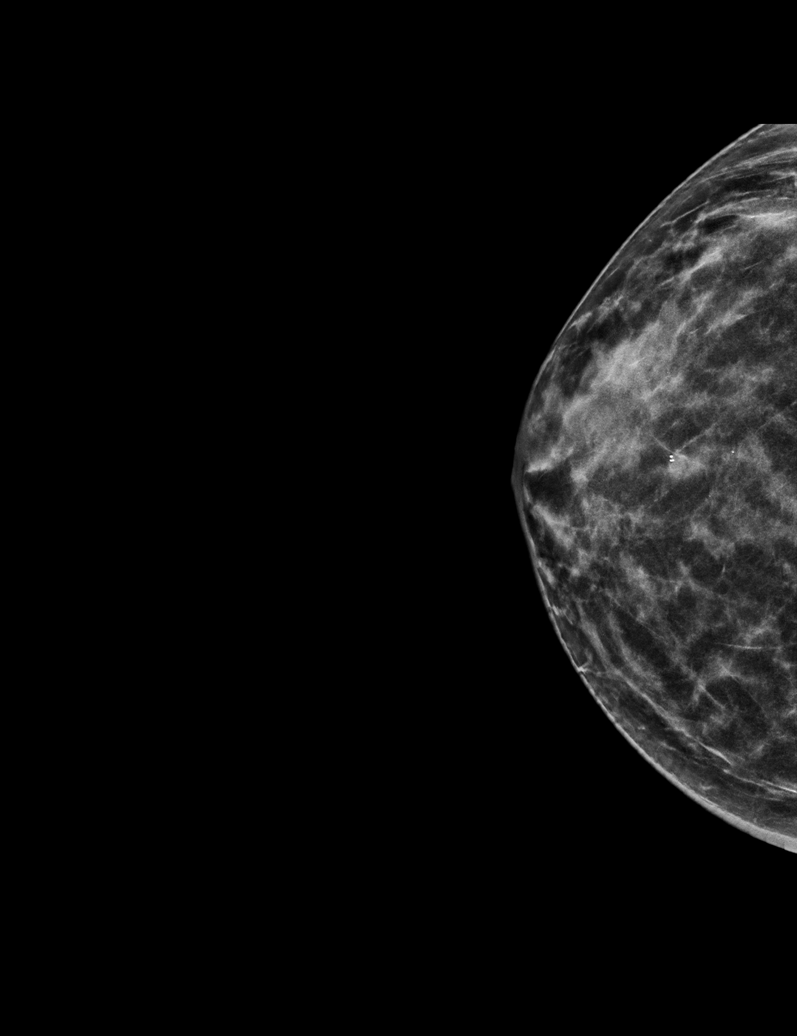

[L CC synth-2D]
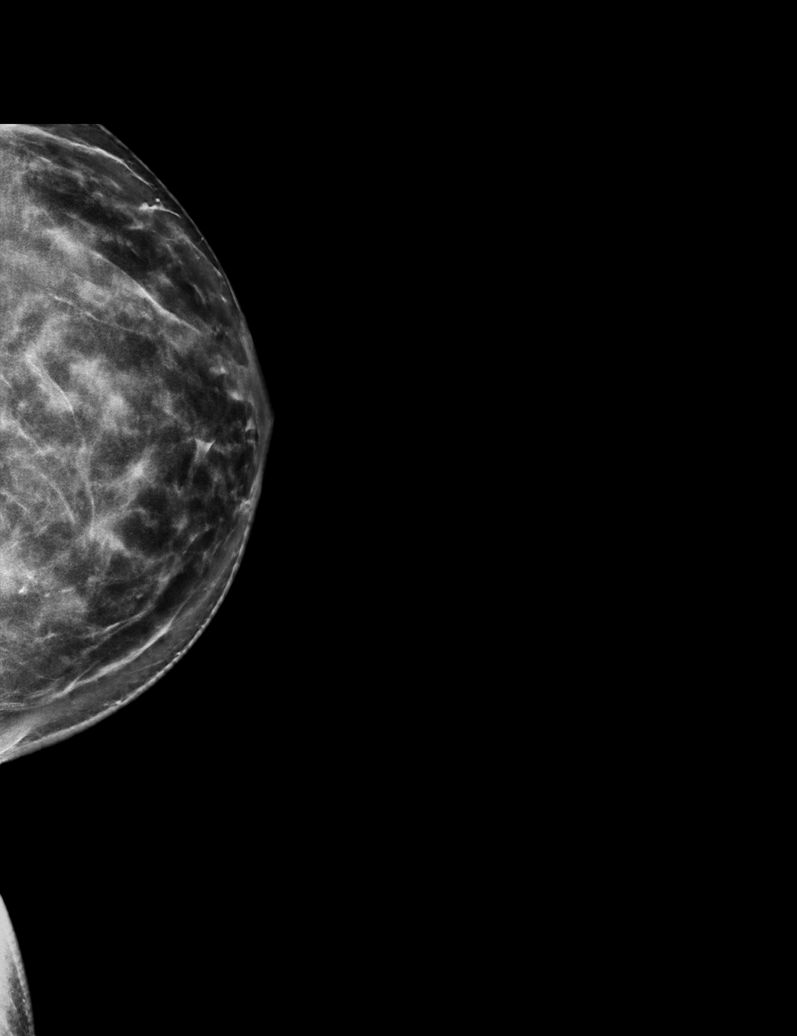

[R MLO synth-2D]
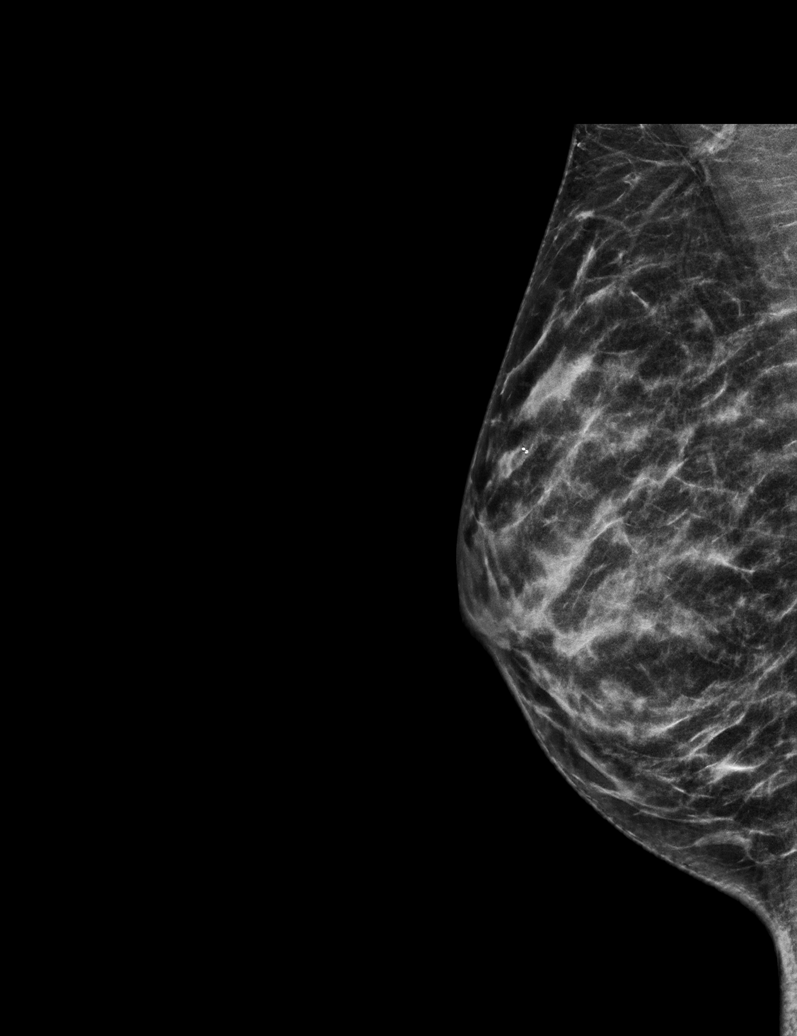

[L ML synth-2D]
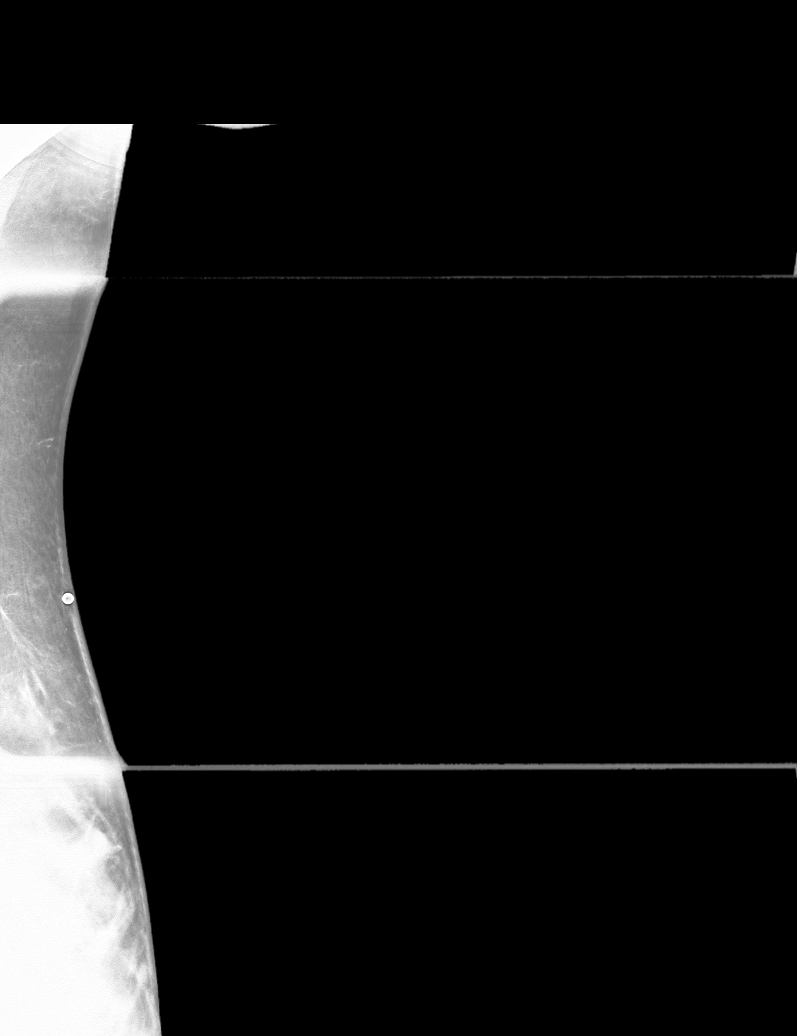

[L MLO synth-2D]
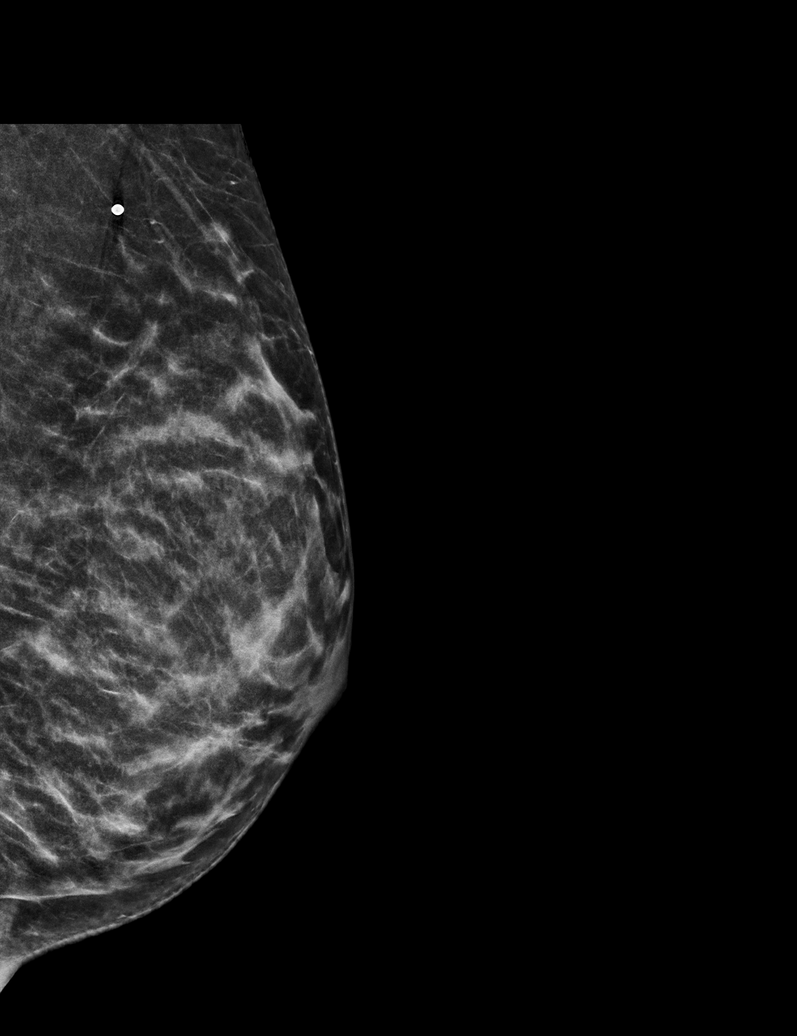

[L CC tomo · tomo slice 39/76.0]
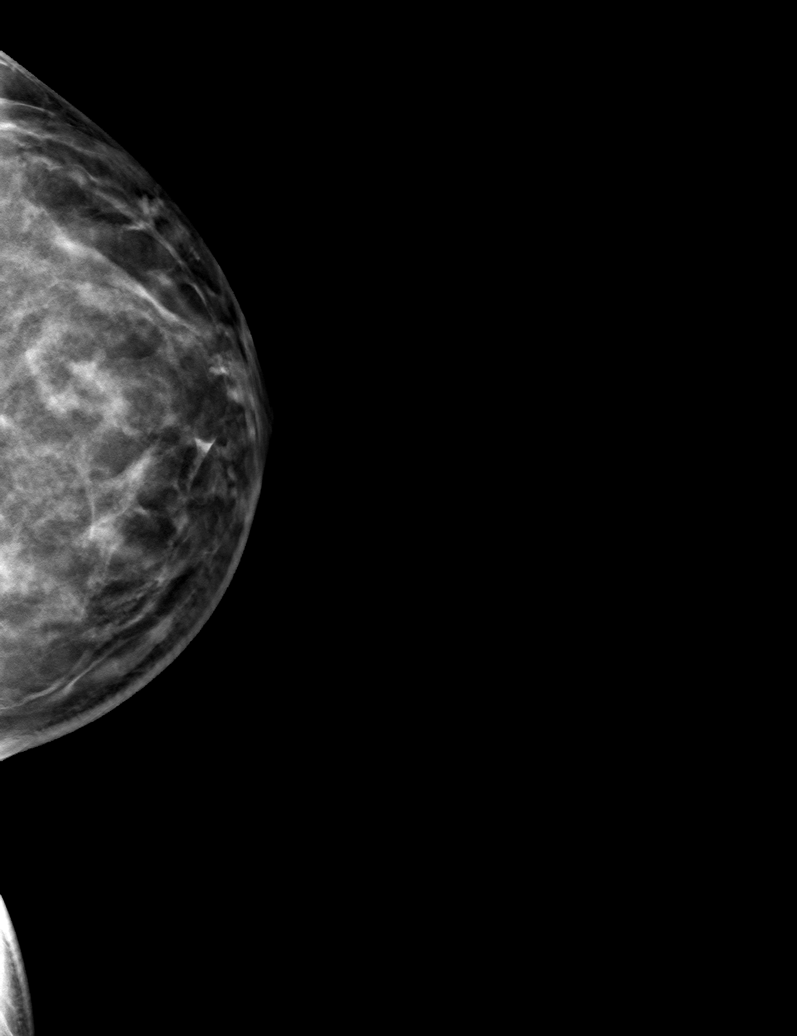

[6 of 30 positions shown; findings below may reference images not displayed]

ACR Breast Density Category c: The breast tissue is heterogeneously
dense, which may obscure small masses.
FINDINGS: A radiopaque BB was placed at the site of the patient's focal
symptoms in the far upper outer left breast. No focal or suspicious
findings are seen deep to the radiopaque BB.

No additional suspicious findings are identified in the remainder of
either breast.

Mammographic images were processed with CAD.

Targeted ultrasound is performed, showing normal fibroglandular
tissue without focal or suspicious sonographic abnormality at the
position cm from the nipple.
IMPRESSION: 1. No mammographic evidence of malignancy in either breast.
2. No suspicious sonographic findings at the site of the patient's
focal left breast symptoms.

RECOMMENDATION:
1. Clinical follow-up recommended for the painful/palpable area of
concern in the left breast. Any further workup should be based on
clinical grounds.
2. Screening mammogram at age 40 unless there are persistent or
intervening clinical concerns. (Code:[90]).

I have discussed the findings and recommendations with the patient.
If applicable, a reminder letter will be sent to the patient
regarding the next appointment.

BI-RADS CATEGORY  1: Negative.
